# Patient Record
Sex: Female | Born: 1995
Health system: Southern US, Community
[De-identification: ages and names within clinical notes are randomized; demographics above are authoritative.]

## PROBLEM LIST (undated history)

## (undated) DIAGNOSIS — D649 Anemia, unspecified: Secondary | ICD-10-CM

## (undated) DIAGNOSIS — J45909 Unspecified asthma, uncomplicated: Secondary | ICD-10-CM

## (undated) DIAGNOSIS — F419 Anxiety disorder, unspecified: Secondary | ICD-10-CM

## (undated) DIAGNOSIS — O24419 Gestational diabetes mellitus in pregnancy, unspecified control: Secondary | ICD-10-CM

## (undated) HISTORY — DX: Gestational diabetes mellitus in pregnancy, unspecified control: O24.419

## (undated) HISTORY — DX: Unspecified asthma, uncomplicated: J45.909

## (undated) HISTORY — PX: NO PAST SURGERIES: SHX2092

## (undated) HISTORY — DX: Anxiety disorder, unspecified: F41.9

---

## 2017-02-24 ENCOUNTER — Ambulatory Visit (INDEPENDENT_AMBULATORY_CARE_PROVIDER_SITE_OTHER): Payer: 59 | Admitting: Advanced Practice Midwife

## 2017-02-24 ENCOUNTER — Other Ambulatory Visit (HOSPITAL_COMMUNITY)
Admission: RE | Admit: 2017-02-24 | Discharge: 2017-02-24 | Disposition: A | Discharge status: Home / Self Care | Payer: 59 | Source: Ambulatory Visit | Attending: Advanced Practice Midwife | Admitting: Advanced Practice Midwife

## 2017-02-24 ENCOUNTER — Encounter: Payer: Self-pay | Admitting: Advanced Practice Midwife

## 2017-02-24 VITALS — BP 138/70 | HR 64 | Ht 63.25 in | Wt 238.0 lb

## 2017-02-24 DIAGNOSIS — Z01419 Encounter for gynecological examination (general) (routine) without abnormal findings: Secondary | ICD-10-CM

## 2017-02-24 DIAGNOSIS — R87610 Atypical squamous cells of undetermined significance on cytologic smear of cervix (ASC-US): Secondary | ICD-10-CM | POA: Diagnosis not present

## 2017-02-24 MED ORDER — NORGESTIMATE-ETH ESTRADIOL 0.25-35 MG-MCG PO TABS: 1.0000 | ORAL_TABLET | Freq: Every day | ORAL | 3 refills | Status: DC

## 2017-02-24 NOTE — Progress Notes (Signed)
Monique Fox 21 y.o.  Vitals:   02/24/17 0901  BP: 138/70  Pulse: 64     Filed Weights   02/24/17 0901  Weight: 238 lb (108 kg)    Past Medical History: Past Medical History:  Diagnosis Date  . Asthma     Past Surgical History: History reviewed. No pertinent surgical history.  Family History: Family History  Problem Relation Age of Onset  . Cancer Paternal Grandmother     cervical, abdominal, colon  . Breast cancer Paternal Grandmother   . Diabetes Maternal Grandmother   . Stroke Maternal Grandmother   . Cancer Maternal Grandfather     sinus    Social History: Social History  Substance Use Topics  . Smoking status: Never Smoker  . Smokeless tobacco: Never Used  . Alcohol use 0.6 oz/week    1 Glasses of wine per week    Allergies: No Known Allergies   No current outpatient prescriptions on file.  History of Present Illness: Here for first pap smear.  Using condoms for birth control., wants pills. Has not had gardisil.  Works at [employer]   Review of Systems   Patient denies any headaches, blurred vision, shortness of breath, chest pain, abdominal pain, problems with bowel movements, urination, or intercourse.   Physical Exam: General:  Well developed, well nourished, no acute distress Skin:  Warm and dry Neck:  Midline trachea, normal thyroid Lungs; Clear to auscultation bilaterally Breast:  No dominant palpable mass, retraction, or nipple discharge Cardiovascular: Regular rate and rhythm Abdomen:  Soft, non tender, no hepatosplenomegaly Pelvic:  External genitalia is normal in appearance.  The vagina is normal in appearance.  The cervix is non friable. Uterus is felt to be normal size, shape, and contour.  No adnexal masses or tenderness noted. Exam limited by habitus Extremities:  No swelling or varicosities noted Psych:  No mood changes.     Impression: Normal gyn exam     Plan: If pap normal, repeat q 3 years.  Start COC on first  day of next period.

## 2017-02-24 NOTE — Patient Instructions (Addendum)
Start your birth control pill on the first day of your next period.      HPV (Human Papillomavirus) Vaccine: What You Need to Know 1. Why get vaccinated? HPV vaccine prevents infection with human papillomavirus (HPV) types that are associated with many cancers, including:  cervical cancer in females,  vaginal and vulvar cancers in females,  anal cancer in females and males,  throat cancer in females and males, and  penile cancer in males. In addition, HPV vaccine prevents infection with HPV types that cause genital warts in both females and males. In the [place] about 12,000 women get cervical cancer every year, and about 4,000 women die from it. HPV vaccine can prevent most of these cases of cervical cancer. Vaccination is not a substitute for cervical cancer screening. This vaccine does not protect against all HPV types that can cause cervical cancer. Women should still get regular Pap tests.  HPV infection usually comes from sexual contact, and most people will become infected at some point in their life. About 14 million Americans, including teens, get infected every year. Most infections will go away on their own and not cause serious problems. But thousands of women and men get cancer and other diseases from HPV. 2. HPV vaccine HPV vaccine is approved by FDA and is recommended by CDC for both males and females. It is routinely given at 22 or 22 years of age, but it may be given beginning at age 22 years through age 30 years. Most adolescents 9 through 22 years of age should get HPV vaccine as a two-dose series with the doses separated by 6-12 months. People who start HPV vaccination at 22 years of age and older should get the vaccine as a three-dose series with the second dose given 1-2 months after the first dose and the third dose given 6 months after the first dose. There are several exceptions to these age recommendations. Your health care provider can give you more information. 3.  Some people should not get this vaccine  Anyone who has had a severe (life-threatening) allergic reaction to a dose of HPV vaccine should not get another dose.  Anyone who has a severe (life threatening) allergy to any component of HPV vaccine should not get the vaccine.  Tell your doctor if you have any severe allergies that you know of, including a severe allergy to yeast.  HPV vaccine is not recommended for pregnant women. If you learn that you were pregnant when you were vaccinated, there is no reason to expect any problems for you or your baby. Any woman who learns she was pregnant when she got HPV vaccine is encouraged to contact the manufacturer's registry for HPV vaccination during pregnancy at [phone]. Women who are breastfeeding may be vaccinated.  If you have a mild illness, such as a cold, you can probably get the vaccine today. If you are moderately or severely ill, you should probably wait until you recover. Your doctor can advise you. 4. Risks of a vaccine reaction With any medicine, including vaccines, there is a chance of side effects. These are usually mild and go away on their own, but serious reactions are also possible. Most people who get HPV vaccine do not have any serious problems with it. Mild or moderate problems following HPV vaccine:   Reactions in the arm where the shot was given:  Soreness (about 9 people in 10)  Redness or swelling (about 1 person in 3)  Fever:  Mild (100F) (about 1  person in 10)  Moderate (102F) (about 1 person in 64)  Other problems:  Headache (about 1 person in 3) Problems that could happen after any injected vaccine:   People sometimes faint after a medical procedure, including vaccination. Sitting or lying down for about 15 minutes can help prevent fainting, and injuries caused by a fall. Tell your doctor if you feel dizzy, or have vision changes or ringing in the ears.  Some people get severe pain in the shoulder and  have difficulty moving the arm where a shot was given. This happens very rarely.  Any medication can cause a severe allergic reaction. Such reactions from a vaccine are very rare, estimated at about 1 in a million doses, and would happen within a few minutes to a few hours after the vaccination. As with any medicine, there is a very remote chance of a vaccine causing a serious injury or death. The safety of vaccines is always being monitored. For more information, visit: http://www.aguilar.org/. 5. What if there is a serious reaction? What should I look for?  Look for anything that concerns you, such as signs of a severe allergic reaction, very high fever, or unusual behavior. Signs of a severe allergic reaction can include hives, swelling of the face and throat, difficulty breathing, a fast heartbeat, dizziness, and weakness. These would usually start a few minutes to a few hours after the vaccination. What should I do?  If you think it is a severe allergic reaction or other emergency that can't wait, call 9-1-1 or get to the nearest hospital. Otherwise, call your doctor. Afterward, the reaction should be reported to the Vaccine Adverse Event Reporting System (VAERS). Your doctor should file this report, or you can do it yourself through the VAERS web site at www.vaers.SamedayNews.es, or by calling [phone]. VAERS does not give medical advice.  6. The National Vaccine Injury Compensation Program The [employer] Vaccine Injury Compensation Program (VICP) is a federal program that was created to compensate people who may have been injured by certain vaccines. Persons who believe they may have been injured by a vaccine can learn about the program and about filing a claim by calling (431)600-1203 or visiting the [place] website at GoldCloset.com.ee. There is a time limit to file a claim for compensation. 7. How can I learn more?  Ask your health care provider. He or she can give you the  vaccine package insert or suggest other sources of information.  Call your local or state health department.  Contact the Centers for Disease Control and Prevention (CDC):  Call (218)685-8058 (1-800-CDC-INFO) or  Visit CDC's website at http://sweeney-todd.com/ Vaccine Information Statement, HPV Vaccine (10/05/2015) This information is not intended to replace advice given to you by your health care provider. Make sure you discuss any questions you have with your health care provider. Document Released: 05/17/2014 Document Revised: 07/10/2016 Document Reviewed: 07/10/2016 Elsevier Interactive Patient Education  2017 [employer].

## 2017-03-02 LAB — CYTOLOGY - PAP
Chlamydia: NEGATIVE
DIAGNOSIS: UNDETERMINED — AB
HPV: NOT DETECTED
NEISSERIA GONORRHEA: NEGATIVE

## 2017-03-03 ENCOUNTER — Encounter: Payer: Self-pay | Admitting: Advanced Practice Midwife

## 2017-03-26 ENCOUNTER — Encounter: Payer: Self-pay | Admitting: Advanced Practice Midwife

## 2017-04-29 DIAGNOSIS — J0100 Acute maxillary sinusitis, unspecified: Secondary | ICD-10-CM | POA: Diagnosis not present

## 2017-05-16 ENCOUNTER — Encounter: Payer: Self-pay | Admitting: Advanced Practice Midwife

## 2017-05-19 ENCOUNTER — Other Ambulatory Visit: Payer: Self-pay | Admitting: Advanced Practice Midwife

## 2017-05-19 MED ORDER — NORGESTIMATE-ETH ESTRADIOL 0.25-35 MG-MCG PO TABS: 1.0000 | ORAL_TABLET | Freq: Every day | ORAL | 3 refills | Status: DC

## 2017-05-19 MED ORDER — FLUCONAZOLE 150 MG PO TABS: ORAL_TABLET | ORAL | 2 refills | Status: DC

## 2017-05-19 NOTE — Progress Notes (Signed)
diflu

## 2017-06-22 SURGERY — Surgical Case
Anesthesia: *Unknown

## 2017-09-12 ENCOUNTER — Encounter: Payer: Self-pay | Admitting: Advanced Practice Midwife

## 2017-09-28 ENCOUNTER — Telehealth: Payer: 59 | Admitting: Family

## 2017-09-28 DIAGNOSIS — L235 Allergic contact dermatitis due to other chemical products: Secondary | ICD-10-CM | POA: Diagnosis not present

## 2017-09-28 MED ORDER — PREDNISONE 10 MG PO TABS: 10.0000 mg | ORAL_TABLET | ORAL | 0 refills | Status: DC

## 2017-09-28 NOTE — Progress Notes (Signed)
Thank you for the details you included in the comment boxes. Those details are very helpful in determining the best course of treatment for you and help [place] to provide the best care. Usually, these reactions will clear up quite quickly. The only option here is prednisone. That usually works when all else fails. Congrats on your wedding! It will likely be cleared up by then as I have seen far worse reactions clear up with this treatment I'm giving you. No promises, but I'm very optimistic!  E Visit for Rash  We are sorry that you are not feeling well. Here is how we plan to help!  Based on what you shared with me it looks like you have contact dermatitis.  Contact dermatitis is a skin rash caused by something that touches the skin and causes irritation or inflammation.  Your skin may be red, swollen, dry, cracked, and itch.  The rash should go away in a few days but can last a few weeks.  If you get a rash, it's important to figure out what caused it so the irritant can be avoided in the future.  I have sent the following:   Sterapred 10 mg dose pak   HOME CARE:   Take cool showers and avoid direct sunlight.  Apply cool compress or wet dressings.  Take a bath in an oatmeal bath.  Sprinkle content of one Aveeno packet under running faucet with comfortably warm water.  Bathe for 15-20 minutes, 1-2 times daily.  Pat dry with a towel. Do not rub the rash.  Use hydrocortisone cream.  Take an antihistamine like Benadryl for widespread rashes that itch.  The adult dose of Benadryl is 25-50 mg by mouth 4 times daily.  Caution:  This type of medication may cause sleepiness.  Do not drink alcohol, drive, or operate dangerous machinery while taking antihistamines.  Do not take these medications if you have prostate enlargement.  Read package instructions thoroughly on all medications that you take.  GET HELP RIGHT AWAY IF:   Symptoms don't go away after treatment.  Severe itching that  persists.  If you rash spreads or swells.  If you rash begins to smell.  If it blisters and opens or develops a yellow-brown crust.  You develop a fever.  You have a sore throat.  You become short of breath.  MAKE SURE YOU:  Understand these instructions. Will watch your condition. Will get help right away if you are not doing well or get worse.  Thank you for choosing an e-visit. Your e-visit answers were reviewed by a board certified advanced clinical practitioner to complete your personal care plan. Depending upon the condition, your plan could have included both over the counter or prescription medications. Please review your pharmacy choice. Be sure that the pharmacy you have chosen is open so that you can pick up your prescription now.  If there is a problem you may message your provider in [place] to have the prescription routed to another pharmacy. Your safety is important to [place]. If you have drug allergies check your prescription carefully.  For the next 24 hours, you can use MyChart to ask questions about today's visit, request a non-urgent call back, or ask for a work or school excuse from your e-visit provider. You will get an email in the next two days asking about your experience. I hope that your e-visit has been valuable and will speed your recovery.

## 2017-10-23 ENCOUNTER — Telehealth: Payer: 59 | Admitting: Family

## 2017-10-23 DIAGNOSIS — L259 Unspecified contact dermatitis, unspecified cause: Secondary | ICD-10-CM

## 2017-10-23 MED ORDER — PREDNISONE 10 MG PO TABS: 10.0000 mg | ORAL_TABLET | ORAL | 0 refills | Status: DC

## 2017-10-23 NOTE — Progress Notes (Signed)
Thank you for the details you included in the comment boxes. Those details are very helpful in determining the best course of treatment for you and help [place] to provide the best care.  E Visit for Rash  We are sorry that you are not feeling well. Here is how we plan to help!  Based on what you shared with me it looks like you have contact dermatitis.  Contact dermatitis is a skin rash caused by something that touches the skin and causes irritation or inflammation.  Your skin may be red, swollen, dry, cracked, and itch.  The rash should go away in a few days but can last a few weeks.  If you get a rash, it's important to figure out what caused it so the irritant can be avoided in the future.  Prednisone sterapred pack 10mg  sent to pharmacy  HOME CARE:   Take cool showers and avoid direct sunlight.  Apply cool compress or wet dressings.  Take a bath in an oatmeal bath.  Sprinkle content of one Aveeno packet under running faucet with comfortably warm water.  Bathe for 15-20 minutes, 1-2 times daily.  Pat dry with a towel. Do not rub the rash.  Use hydrocortisone cream.  Take an antihistamine like Benadryl for widespread rashes that itch.  The adult dose of Benadryl is 25-50 mg by mouth 4 times daily.  Caution:  This type of medication may cause sleepiness.  Do not drink alcohol, drive, or operate dangerous machinery while taking antihistamines.  Do not take these medications if you have prostate enlargement.  Read package instructions thoroughly on all medications that you take.  GET HELP RIGHT AWAY IF:   Symptoms don't go away after treatment.  Severe itching that persists.  If you rash spreads or swells.  If you rash begins to smell.  If it blisters and opens or develops a yellow-brown crust.  You develop a fever.  You have a sore throat.  You become short of breath.  MAKE SURE YOU:  Understand these instructions. Will watch your condition. Will get help right away if you  are not doing well or get worse.  Thank you for choosing an e-visit. Your e-visit answers were reviewed by a board certified advanced clinical practitioner to complete your personal care plan. Depending upon the condition, your plan could have included both over the counter or prescription medications. Please review your pharmacy choice. Be sure that the pharmacy you have chosen is open so that you can pick up your prescription now.  If there is a problem you may message your provider in [place] to have the prescription routed to another pharmacy. Your safety is important to [place]. If you have drug allergies check your prescription carefully.  For the next 24 hours, you can use MyChart to ask questions about today's visit, request a non-urgent call back, or ask for a work or school excuse from your e-visit provider. You will get an email in the next two days asking about your experience. I hope that your e-visit has been valuable and will speed your recovery.

## 2017-11-14 ENCOUNTER — Encounter: Payer: Self-pay | Admitting: Advanced Practice Midwife

## 2017-12-02 DIAGNOSIS — J4520 Mild intermittent asthma, uncomplicated: Secondary | ICD-10-CM | POA: Diagnosis not present

## 2017-12-25 DIAGNOSIS — D485 Neoplasm of uncertain behavior of skin: Secondary | ICD-10-CM | POA: Diagnosis not present

## 2017-12-25 DIAGNOSIS — D225 Melanocytic nevi of trunk: Secondary | ICD-10-CM | POA: Diagnosis not present

## 2017-12-27 ENCOUNTER — Telehealth: Payer: 59 | Admitting: Physician Assistant

## 2017-12-27 DIAGNOSIS — R6889 Other general symptoms and signs: Secondary | ICD-10-CM

## 2017-12-27 MED ORDER — OSELTAMIVIR PHOSPHATE 75 MG PO CAPS: 75.0000 mg | ORAL_CAPSULE | Freq: Two times a day (BID) | ORAL | 0 refills | Status: DC

## 2017-12-27 NOTE — Progress Notes (Signed)

## 2018-04-16 DIAGNOSIS — Z0100 Encounter for examination of eyes and vision without abnormal findings: Secondary | ICD-10-CM | POA: Diagnosis not present

## 2019-01-15 DIAGNOSIS — J302 Other seasonal allergic rhinitis: Secondary | ICD-10-CM | POA: Diagnosis not present

## 2019-01-15 DIAGNOSIS — J4530 Mild persistent asthma, uncomplicated: Secondary | ICD-10-CM | POA: Diagnosis not present

## 2020-07-06 ENCOUNTER — Other Ambulatory Visit: Payer: Self-pay

## 2020-07-06 ENCOUNTER — Emergency Department (HOSPITAL_BASED_OUTPATIENT_CLINIC_OR_DEPARTMENT_OTHER)
Admission: EM | Admit: 2020-07-06 | Discharge: 2020-07-06 | Disposition: A | Discharge status: Home / Self Care | Payer: No Typology Code available for payment source | Attending: Emergency Medicine | Admitting: Emergency Medicine

## 2020-07-06 ENCOUNTER — Encounter (HOSPITAL_BASED_OUTPATIENT_CLINIC_OR_DEPARTMENT_OTHER): Payer: Self-pay

## 2020-07-06 ENCOUNTER — Emergency Department (HOSPITAL_BASED_OUTPATIENT_CLINIC_OR_DEPARTMENT_OTHER): Payer: No Typology Code available for payment source

## 2020-07-06 DIAGNOSIS — J45909 Unspecified asthma, uncomplicated: Secondary | ICD-10-CM | POA: Diagnosis not present

## 2020-07-06 DIAGNOSIS — R531 Weakness: Secondary | ICD-10-CM | POA: Insufficient documentation

## 2020-07-06 DIAGNOSIS — R202 Paresthesia of skin: Secondary | ICD-10-CM | POA: Diagnosis not present

## 2020-07-06 DIAGNOSIS — R519 Headache, unspecified: Secondary | ICD-10-CM | POA: Diagnosis present

## 2020-07-06 DIAGNOSIS — H539 Unspecified visual disturbance: Secondary | ICD-10-CM | POA: Diagnosis not present

## 2020-07-06 HISTORY — DX: Anemia, unspecified: D64.9

## 2020-07-06 LAB — COMPREHENSIVE METABOLIC PANEL
ALT: 17 U/L (ref 0–44)
AST: 14 U/L — ABNORMAL LOW (ref 15–41)
Albumin: 3.5 g/dL (ref 3.5–5.0)
Alkaline Phosphatase: 72 U/L (ref 38–126)
Anion gap: 8 (ref 5–15)
BUN: 8 mg/dL (ref 6–20)
CO2: 25 mmol/L (ref 22–32)
Calcium: 8.6 mg/dL — ABNORMAL LOW (ref 8.9–10.3)
Chloride: 103 mmol/L (ref 98–111)
Creatinine, Ser: 0.73 mg/dL (ref 0.44–1.00)
GFR calc Af Amer: >60 mL/min (ref >60)
GFR calc non Af Amer: >60 mL/min (ref >60)
Glucose, Bld: 98 mg/dL (ref 70–99)
Potassium: 3.9 mmol/L (ref 3.5–5.1)
Sodium: 136 mmol/L (ref 135–145)
Total Bilirubin: 0.3 mg/dL (ref 0.3–1.2)
Total Protein: 7.2 g/dL (ref 6.5–8.1)

## 2020-07-06 LAB — PROTIME-INR
INR: 1 (ref 0.8–1.2)
Prothrombin Time: 12.4 seconds (ref 11.4–15.2)

## 2020-07-06 LAB — CBG MONITORING, ED: Glucose-Capillary: 93 mg/dL (ref 70–99)

## 2020-07-06 LAB — DIFFERENTIAL
Abs Immature Granulocytes: 0.04 10*3/uL (ref 0.00–0.07)
Basophils Absolute: 0.1 10*3/uL (ref 0.0–0.1)
Basophils Relative: 1 %
Eosinophils Absolute: 0.1 10*3/uL (ref 0.0–0.5)
Eosinophils Relative: 1 %
Immature Granulocytes: 0 %
Lymphocytes Relative: 16 %
Lymphs Abs: 2.1 10*3/uL (ref 0.7–4.0)
Monocytes Absolute: 0.7 10*3/uL (ref 0.1–1.0)
Monocytes Relative: 5 %
Neutro Abs: 10.4 10*3/uL — ABNORMAL HIGH (ref 1.7–7.7)
Neutrophils Relative %: 77 %

## 2020-07-06 LAB — CBC
HCT: 39.7 % (ref 36.0–46.0)
Hemoglobin: 12.4 g/dL (ref 12.0–15.0)
MCH: 26.1 pg (ref 26.0–34.0)
MCHC: 31.2 g/dL (ref 30.0–36.0)
MCV: 83.4 fL (ref 80.0–100.0)
Platelets: 341 10*3/uL (ref 150–400)
RBC: 4.76 MIL/uL (ref 3.87–5.11)
RDW: 18 % — ABNORMAL HIGH (ref 11.5–15.5)
WBC: 13.4 10*3/uL — ABNORMAL HIGH (ref 4.0–10.5)
nRBC: 0 % (ref 0.0–0.2)

## 2020-07-06 LAB — PREGNANCY, URINE: Preg Test, Ur: NEGATIVE

## 2020-07-06 LAB — APTT: aPTT: 27 seconds (ref 24–36)

## 2020-07-06 MED ORDER — DIPHENHYDRAMINE HCL 50 MG/ML IJ SOLN
25.0000 mg | Freq: Once | INTRAMUSCULAR | Status: AC
  Administered 2020-07-06: 25 mg via INTRAVENOUS
  Filled 2020-07-06: qty 1

## 2020-07-06 MED ORDER — PROCHLORPERAZINE EDISYLATE 10 MG/2ML IJ SOLN
10.0000 mg | Freq: Once | INTRAMUSCULAR | Status: AC
  Administered 2020-07-06: 10 mg via INTRAVENOUS
  Filled 2020-07-06: qty 2

## 2020-07-06 NOTE — Discharge Instructions (Addendum)
Your work-up today was overall reassuring.  As discussed, please make sure to return to the ER if you have any new or worsening symptoms.  You may also follow-up with your primary care doctor about the symptoms that you are experiencing today.

## 2020-07-06 NOTE — ED Triage Notes (Addendum)
Sudden loss of peripheral vision on the R side with dizziness, and R sided numbness. Symptoms started at 12:00 today and lasted for 20 minutes. Pt states symptoms have resolved, but now has a lingering HA. BEFAST screening in triage is negative. Pt noted to have a down turned lip on the R side, but pt states that is baseline for her.

## 2020-07-06 NOTE — ED Provider Notes (Addendum)
[facility] EMERGENCY DEPARTMENT Provider Note   CSN: [id number] Arrival date & time: 07/06/20  1244     History No chief complaint on file.   Monique Fox is a 24 y.o. female.  HPI 24 year old female with a history of anemia presents to the ER after an episode of sudden right-sided peripheral vision loss and dizziness with associated right-sided numbness which occurred earlier today.  Patient states that she was on her lunch break and went to go get gas.  When she walked into the gas station, she suddenly felt very dizzy, felt like she lost vision to her right eye, and stated that she felt like her right arm went numb. Denies any chest pain, shortness of breath, nausea, vomiting, fever, cough, back pain at that time. States that this episode lasted about 15 to 20 minutes and then ceased. She now has a different headache, rating it a 7/10. Denies any fevers, neck stiffness, or chills. Denies any residual right-sided numbness, vision has returned to baseline. States she has a history of anemia and asthma but no other medical problems. Recently started nursing school several weeks ago, reports proper nutrition and hydration.  Has no history of migraines. Has never had this happen before.    Past Medical History:  Diagnosis Date  . Anemia   . Asthma     There are no problems to display for this patient.   History reviewed. No pertinent surgical history.   OB History   No obstetric history on file.     No family history on file.  Social History   Tobacco Use  . Smoking status: Never Smoker  . Smokeless tobacco: Never Used  Vaping Use  . Vaping Use: Never used  Substance Use Topics  . Alcohol use: Yes    Comment: rarely  . Drug use: Never    Home Medications Prior to Admission medications   Not on File    Allergies    Patient has no known allergies.  Review of Systems   Review of Systems  Constitutional: Negative for chills and fever.  HENT: Negative  for ear pain and sore throat.   Eyes: Positive for visual disturbance. Negative for pain.  Respiratory: Negative for cough and shortness of breath.   Cardiovascular: Negative for chest pain and palpitations.  Gastrointestinal: Negative for abdominal pain and vomiting.  Genitourinary: Negative for dysuria and hematuria.  Musculoskeletal: Negative for arthralgias and back pain.  Skin: Negative for color change and rash.  Neurological: Positive for dizziness, weakness, numbness and headaches. Negative for tremors, seizures, syncope and light-headedness.  Psychiatric/Behavioral: Negative for confusion.  All other systems reviewed and are negative.   Physical Exam Updated Vital Signs BP (!) 146/97   Pulse 87   Temp 98.4 F (36.9 C) (Oral)   Resp 13   Ht 5\' 3"  (1.6 m)   Wt 117.9 kg   LMP 06/13/2020   SpO2 100%   BMI 46.06 kg/m   Physical Exam Vitals and nursing note reviewed.  Constitutional:      General: She is not in acute distress.    Appearance: She is well-developed. She is obese. She is not ill-appearing or toxic-appearing.  HENT:     Head: Normocephalic and atraumatic.     Nose: Nose normal.     Mouth/Throat:     Mouth: Mucous membranes are moist.     Pharynx: Oropharynx is clear.  Eyes:     Conjunctiva/sclera: Conjunctivae normal.     Pupils: Pupils  are equal, round, and reactive to light.  Cardiovascular:     Rate and Rhythm: Normal rate and regular rhythm.     Pulses: Normal pulses.     Heart sounds: Normal heart sounds. No murmur heard.   Pulmonary:     Effort: Pulmonary effort is normal. No respiratory distress.     Breath sounds: Normal breath sounds.  Abdominal:     General: Abdomen is flat.     Palpations: Abdomen is soft.     Tenderness: There is no abdominal tenderness.  Musculoskeletal:        General: No tenderness or deformity.     Cervical back: Normal range of motion and neck supple.  Skin:    General: Skin is warm and dry.     Findings: No  bruising or erythema.  Neurological:     General: No focal deficit present.     Mental Status: She is alert and oriented to person, place, and time.     Sensory: No sensory deficit.     Motor: No weakness.     Comments: Mental Status:  Alert, thought content appropriate, able to give a coherent history. Speech fluent without evidence of aphasia. Able to follow 2 step commands without difficulty.  Cranial Nerves:  II: Peripheral visual fields grossly normal, pupils equal, round, reactive to light III,IV, VI: ptosis not present, extra-ocular motions intact bilaterally  V,VII: smile symmetric, facial light touch sensation equal VIII: hearing grossly normal to voice  X: uvula elevates symmetrically  XI: bilateral shoulder shrug symmetric and strong XII: midline tongue extension without fassiculations Motor:  Normal tone. 5/5 strength of BUE and BLE major muscle groups including strong and equal grip strength and dorsiflexion/plantar flexion Sensory: light touch normal in all extremities. Cerebellar: normal finger-to-nose with bilateral upper extremities, Romberg sign absent Gait: not accessed    Psychiatric:        Mood and Affect: Mood normal.        Behavior: Behavior normal.     ED Results / Procedures / Treatments   Labs (all labs ordered are listed, but only abnormal results are displayed) Labs Reviewed  CBC - Abnormal; Notable for the following components:      Result Value   WBC 13.4 (*)    RDW 18.0 (*)    All other components within normal limits  DIFFERENTIAL - Abnormal; Notable for the following components:   Neutro Abs 10.4 (*)    All other components within normal limits  COMPREHENSIVE METABOLIC PANEL - Abnormal; Notable for the following components:   Calcium 8.6 (*)    AST 14 (*)    All other components within normal limits  PROTIME-INR  APTT  PREGNANCY, URINE  CBG MONITORING, ED    EKG EKG Interpretation  Date/Time:  Friday July 06 2020 13:46:27  EDT Ventricular Rate:  107 PR Interval:  124 QRS Duration: 80 QT Interval:  340 QTC Calculation: 453 R Axis:   73 Text Interpretation: Sinus tachycardia Otherwise normal ECG No old tracing to compare Confirmed by Calvert Cantor [phone]) on 07/06/2020 1:58:54 PM   Radiology CT HEAD WO CONTRAST  Result Date: 07/06/2020 CLINICAL DATA:  Transient ischemic attack. Sudden loss of peripheral vision. Dizziness and right-sided numbness. EXAM: CT HEAD WITHOUT CONTRAST TECHNIQUE: Contiguous axial images were obtained from the base of the skull through the vertex without intravenous contrast. COMPARISON:  None. FINDINGS: Brain: No evidence acute large vascular territory infarct. Tiny hypodensity in the inferior left basal ganglia is favored  to reflect a dilated perivascular space given the location. No acute hemorrhage. No hydrocephalus. No mass lesion or abnormal mass effect. Vascular: No hyperdense vessel identified Skull: Normal. Negative for fracture or focal lesion. Sinuses/Orbits: No acute finding. Other: No mastoid effusion. IMPRESSION: No evidence of acute intracranial abnormality. Electronically Signed   By: Margaretha Sheffield MD   On: 07/06/2020 14:10    Procedures Procedures (including critical care time)  Medications Ordered in ED Medications  prochlorperazine (COMPAZINE) injection 10 mg (10 mg Intravenous Given 07/06/20 1507)  diphenhydrAMINE (BENADRYL) injection 25 mg (25 mg Intravenous Given 07/06/20 1510)    ED Course  I have reviewed the triage vital signs and the nursing notes.  Pertinent labs & imaging results that were available during my care of the patient were reviewed by me and considered in my medical decision making (see chart for details).    MDM Rules/Calculators/A&P                         24 year old female with right-sided numbness, right-sided peripheral vision loss, dizziness for 15 to 20 minutes. Now asymptomatic other than a residual headache. Presentation, the  patient is alert, oriented, nontoxic-appearing, no acute distress, with no gross neuro deficits. Vitals overall reassuring. Physical exam with no neck stiffness, no neurologic deficits. Abdomen soft and nontender.  DDx includes TIA/stroke, migraines, acute angle closure glaucoma, amarosis fugax,  CRAO/CRVO, optic neuritis, temporal arteritis, anxiety    CT scan done in triage without evidence of acute ischemia. EKG sinus tach but no evidence of arrythmias, ST elevations or T wave inversion.  CBC with a leukocytosis of 13.4.  Patient however denies any fevers, chills, cough, neck stiffness, or any other infectious symptoms.  Pregnancy negative.  BMP normal.  Patient was given migraine cocktail and noted significant improvement in her symptoms.  Tachycardia improved. No reocurring neurologic symptoms, vision remains intact.  Patient does admit that she has been having some anxiety.  Suspect that this might be a cause of her symptoms.  Despite leukocytosis and increased neutrophil count, patient denies any cough, fevers, chills, dysuria, vaginal discharge, or any other infectious symptoms.  We discussed strict return precautions which she voiced understanding and is agreeable to.  At this stage in the ED course, the patient has been medically screened and is stable for discharge.  Final Clinical Impression(s) / ED Diagnoses Final diagnoses:  Nonintractable headache, unspecified chronicity pattern, unspecified headache type    Rx / DC Orders ED Discharge Orders    None         Lyndel Safe 07/06/20 1615    Truddie Hidden, MD 07/06/20 [phone]

## 2020-07-06 NOTE — ED Notes (Signed)
ED Provider at bedside. 

## 2020-10-24 ENCOUNTER — Encounter: Payer: Self-pay | Admitting: Advanced Practice Midwife

## 2021-09-28 IMAGING — CT CT HEAD W/O CM
3 series · 16 of 47 positions shown, 19 images · non-contrast
Comparison: None.

CLINICAL DATA: Transient ischemic attack. Sudden loss of peripheral
vision. Dizziness and right-sided numbness.

EXAM:
CT HEAD WITHOUT CONTRAST
TECHNIQUE: Contiguous axial images were obtained from the base of the skull
through the vertex without intravenous contrast.

[Series 2: head wo · axial · 0.45mm/px · z∈[+844,+974]mm · 10 of 32 slices shown, 13 images]
[im 3/32  brain]
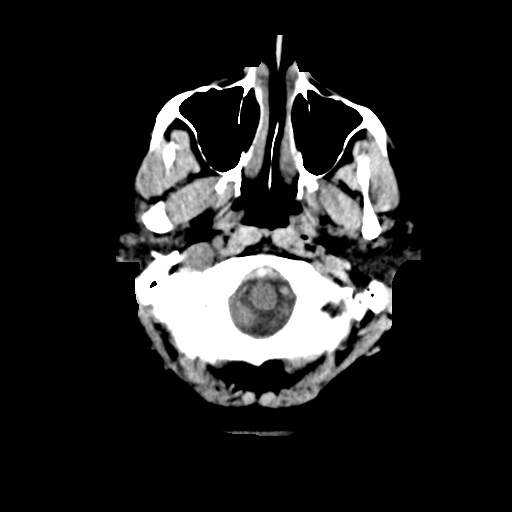
[im 3/32  bone]
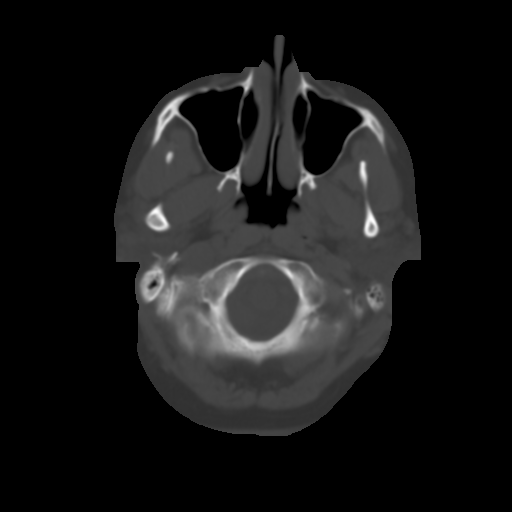
[im 6/32  brain]
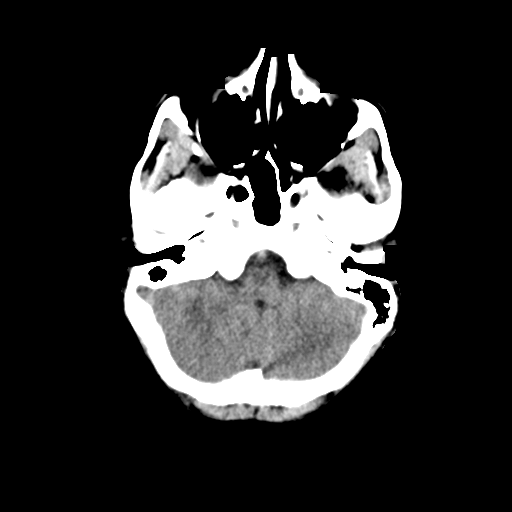
[im 9/32  brain]
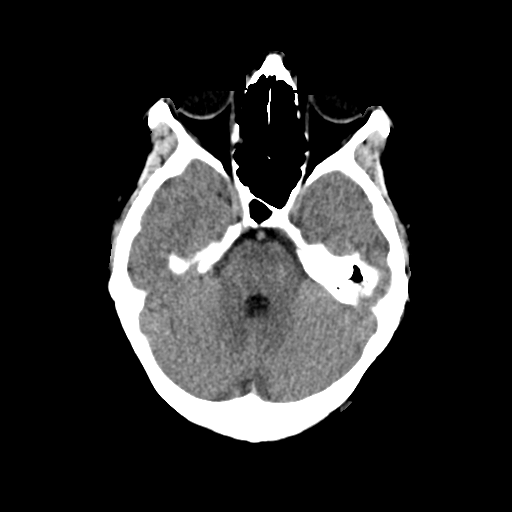
[im 11/32  brain]
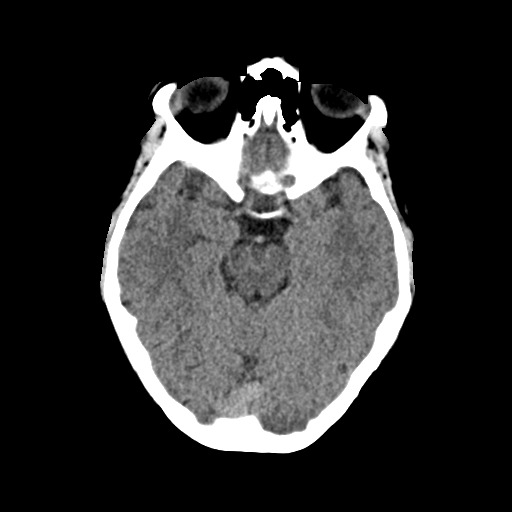
[im 14/32  brain]
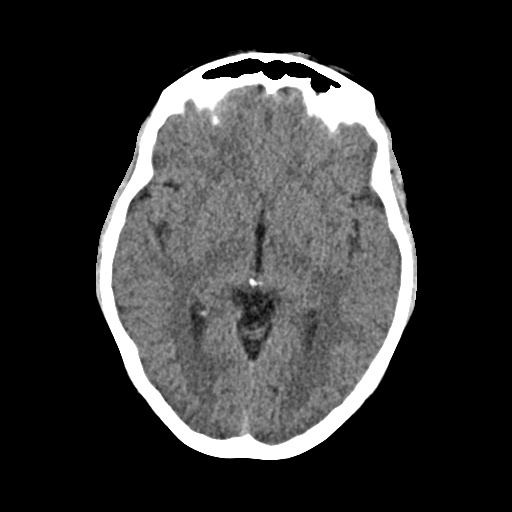
[im 14/32  bone]
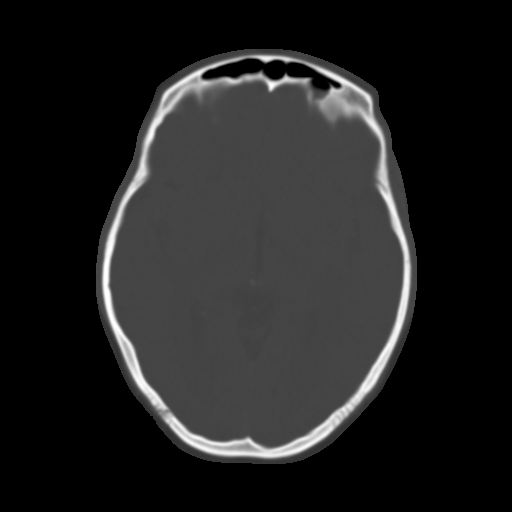
[im 18/32  brain]
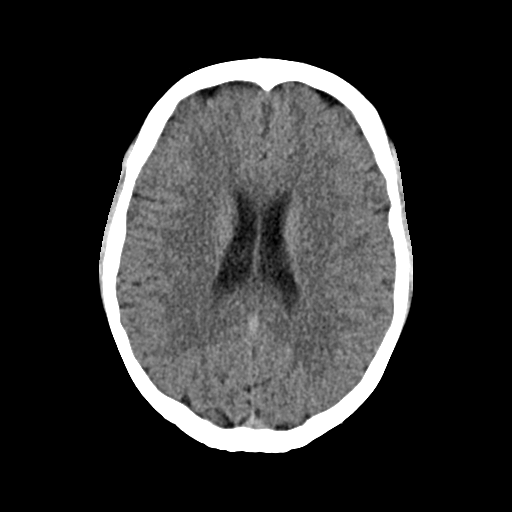
[im 21/32  brain]
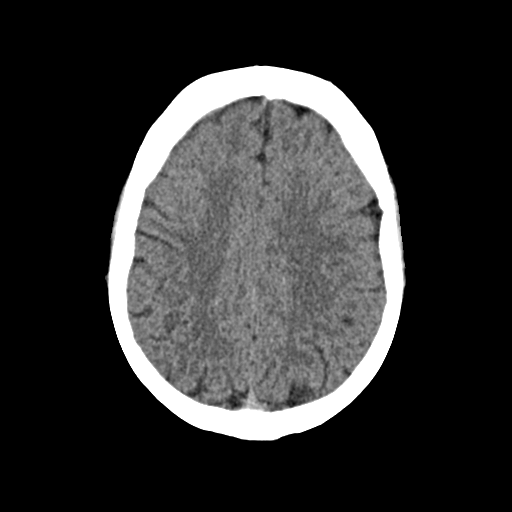
[im 24/32  brain]
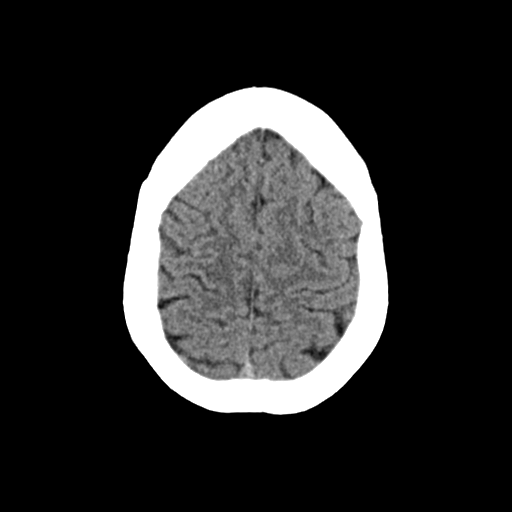
[im 26/32  brain]
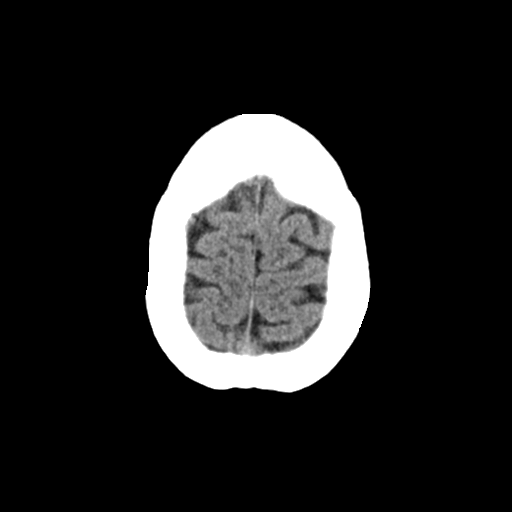
[im 26/32  bone]
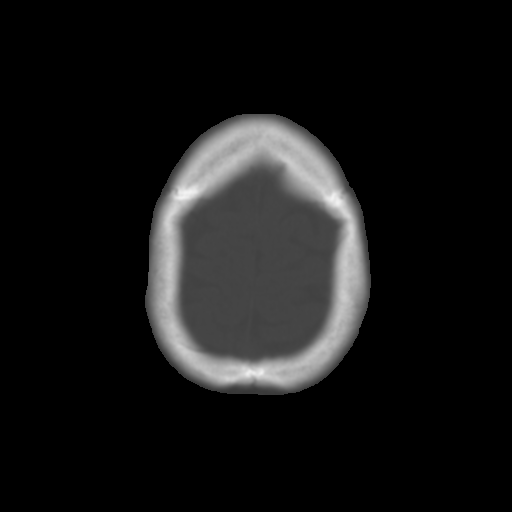
[im 29/32  brain]
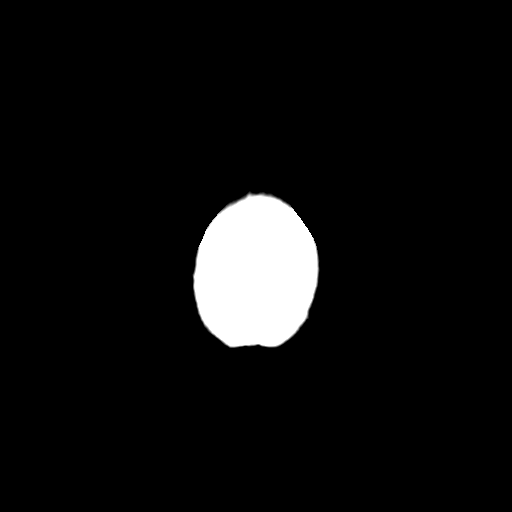

[Series 4: coronal soft · coronal · 0.31mm/px · 3 of 68 slices shown]
[im 23/68  brain]
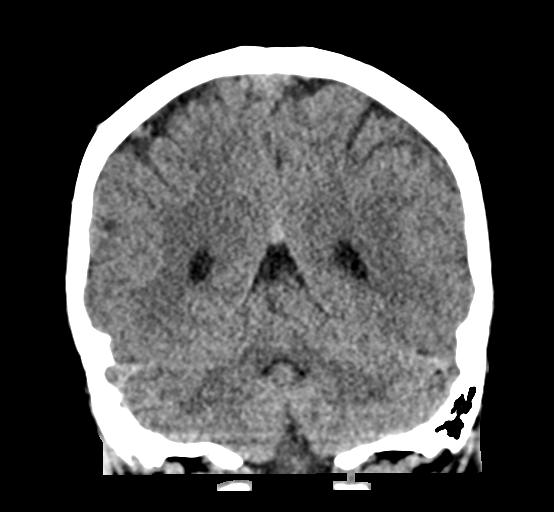
[im 30/68  brain]
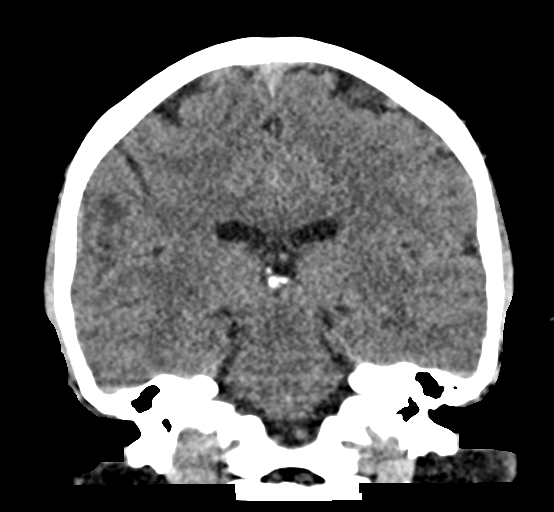
[im 38/68  brain]
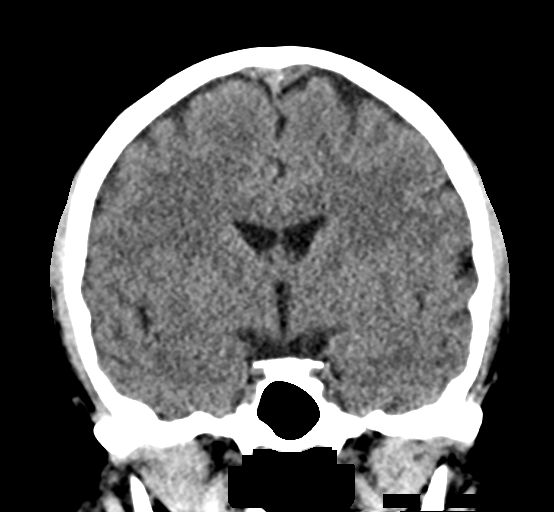

[Series 5: sag soft · sagittal · 0.31mm/px · 3 of 58 slices shown]
[im 20/58  brain]
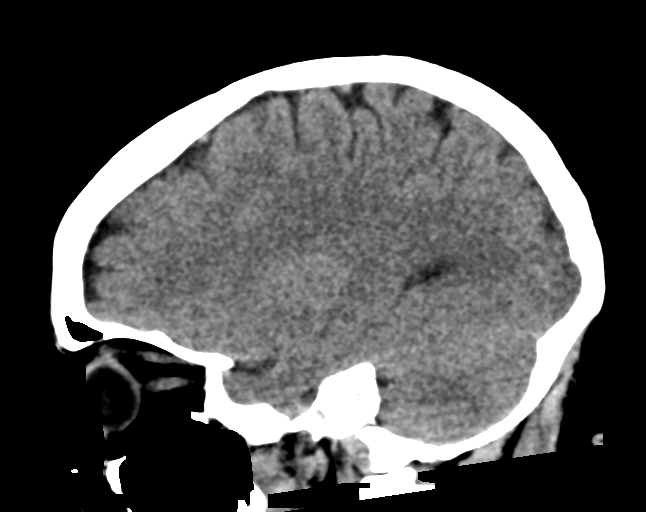
[im 29/58  brain]
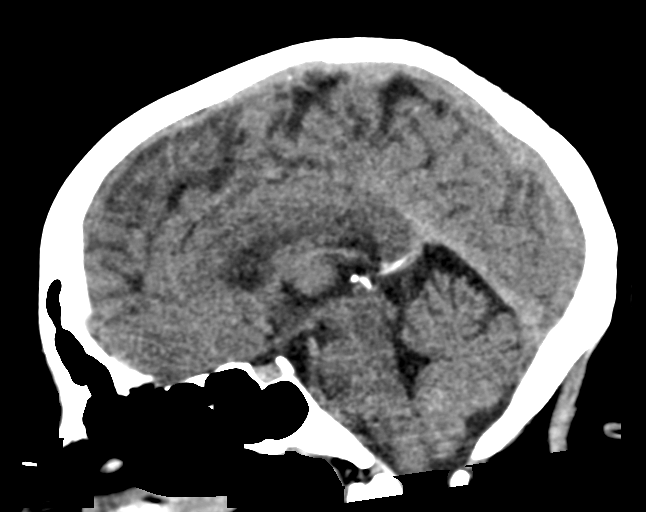
[im 39/58  brain]
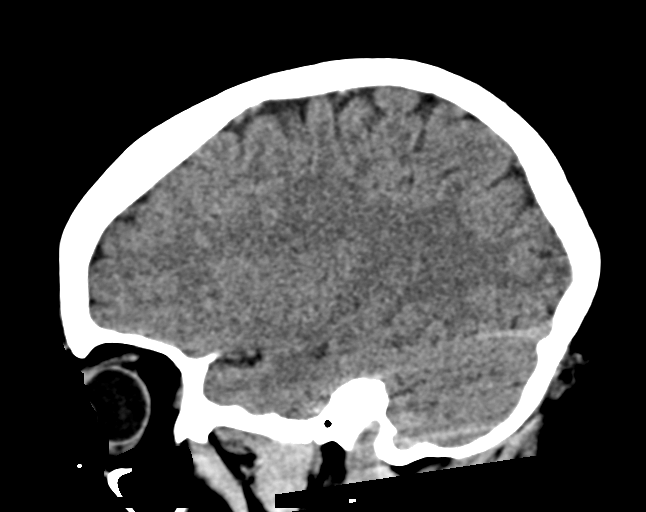

[16 of 47 positions shown; findings below may reference images not displayed]

FINDINGS: Brain: No evidence acute large vascular territory infarct. Tiny
hypodensity in the inferior left basal ganglia is favored to reflect
a dilated perivascular space given the location. No acute
hemorrhage. No hydrocephalus. No mass lesion or abnormal mass
effect.

Vascular: No hyperdense vessel identified

Skull: Normal. Negative for fracture or focal lesion.

Sinuses/Orbits: No acute finding.

Other: No mastoid effusion.
IMPRESSION: No evidence of acute intracranial abnormality.

## 2022-01-23 ENCOUNTER — Encounter: Payer: Self-pay | Admitting: Advanced Practice Midwife

## 2022-01-23 ENCOUNTER — Other Ambulatory Visit: Payer: Self-pay

## 2022-01-23 ENCOUNTER — Ambulatory Visit (INDEPENDENT_AMBULATORY_CARE_PROVIDER_SITE_OTHER): Payer: No Typology Code available for payment source | Admitting: Advanced Practice Midwife

## 2022-01-23 VITALS — BP 126/85 | HR 100 | Ht 63.0 in | Wt 293.0 lb

## 2022-01-23 DIAGNOSIS — Z3189 Encounter for other procreative management: Secondary | ICD-10-CM

## 2022-01-23 MED ORDER — PNV PRENATAL PLUS MULTIVITAMIN 27-1 MG PO TABS: 1.0000 | ORAL_TABLET | Freq: Every day | ORAL | 11 refills | Status: AC

## 2022-01-23 NOTE — Progress Notes (Signed)
[facility] Visit  ?Patient name: Monique Fox MRN [id number]  Date of birth: 10-10-1996 ? ?CC & HPI:  ?Monique Fox is a 26 y.o.  female presenting today for fertility counseling.  Has intercourse usually twice a week, more often if thought she was ovulating. Has monthly periods, only a little irregular .  Husband has not ever gotten anyone pregnant. Motivated to lose weight, has been successful w/low carb.  Has been calorie restricting for 6 months, hasn't worked.  ? ?Pertinent History Reviewed:  ?Medical & Surgical Hx:   ?Past Medical History:  ?Diagnosis Date  ? Anemia   ? Anxiety   ? Asthma   ? ?History reviewed. No pertinent surgical history. ?Family History  ?Problem Relation Age of Onset  ? Cancer Paternal Grandmother   ?     cervical, abdominal, colon  ? Breast cancer Paternal Grandmother   ? Diabetes Maternal Grandmother   ? Stroke Maternal Grandmother   ? Cancer Maternal Grandfather   ?     sinus  ? ? ?Current Outpatient Medications:  ?  escitalopram (LEXAPRO) 5 MG tablet, Take 5 mg by mouth daily., Disp: , Rfl:  ?  levocetirizine (XYZAL) 5 MG tablet, Take 5 mg by mouth at bedtime., Disp: , Rfl:  ?  Prenatal Vit-Fe Fumarate-FA (PNV PRENATAL PLUS MULTIVITAMIN) 27-1 MG TABS, Take 1 tablet by mouth daily., Disp: 30 tablet, Rfl: 11 ?  SYMBICORT 80-4.5 MCG/ACT inhaler, Inhale 2 puffs into the lungs 2 (two) times daily., Disp: , Rfl:  ?Social History: Reviewed -  reports that she has never smoked. She has never used smokeless tobacco. ? ?Review of Systems:   ?Constitutional: Negative for fever and chills ?Eyes: Negative for visual disturbances ?Respiratory: Negative for shortness of breath, dyspnea ?Cardiovascular: Negative for chest pain or palpitations  ?Gastrointestinal: Negative for vomiting, diarrhea and constipation; no abdominal pain ?Genitourinary: Negative for dysuria and urgency, vaginal irritation or itching ?Musculoskeletal: Negative for back pain, joint pain, myalgias  ?Neurological:  Negative for dizziness and headaches ? ? ? ?Objective Findings:  ? ? ?Physical Examination: ?Vitals:  ? 01/23/22 0957  ?BP: 126/85  ?Pulse: 100  ? ?General appearance - well appearing, and in no distress ?Mental status - alert, oriented to person, place, and time ?Chest:  Normal respiratory effort ?Heart - normal rate and regular rhythm ?Musculoskeletal:  Normal range of motion without pain ?Extremities:  No edema ? ?50% or more of this visit was spent in counseling and coordination of care.  15 minutes of face to face time. ? ? ?No results found for this or any previous visit (from the past 24 hour(s)).  ? ? ?Assessment & Plan:  ?A:  ? Fertility counseling ?P: ? Semen analysis ordered.  If all normal, will rx clomid  ? No follow-ups on file. ? ?Christin Fudge CNM ?01/23/2022 ?10:44 AM ? ? ? ? ? ?

## 2022-01-23 NOTE — Patient Instructions (Signed)
Clomiphene Tablets ?What is this medication? ?CLOMIPHENE (KLOE mi feen) treats irregular or absent ovulation in people trying to get pregnant. It works by Building control surveyor produce an egg (ovulation), which increases the chance of pregnancy. ?This medicine may be used for other purposes; ask your health care provider or pharmacist if you have questions. ?COMMON BRAND NAME(S): Clomid, Serophene ?What should I tell my care team before I take this medication? ?They need to know if you have any of these conditions: ?Adrenal gland disease ?Blood vessel disease or blood clots ?Cyst on the ovary ?Endometriosis ?Liver disease ?Ovarian cancer ?Pituitary gland disease ?Vaginal bleeding that has not been evaluated ?An unusual or allergic reaction to clomiphene, other medications, foods, dyes, or preservatives ?Pregnant (should not be used if you are already pregnant) ?Breast-feeding ?How should I use this medication? ?Take this medication by mouth with a glass of water. Follow the directions on the prescription label. Take exactly as directed for the exact number of days prescribed. Take your doses at regular intervals. Most women take this medication for a 5-day period, but the length of treatment may be adjusted. Your care team will give you a start date for this medication and will give you instructions on proper use. Do not take your medication more often than directed. ?Talk to your care team about the use of this medication in children. Special care may be needed. ?Overdosage: If you think you have taken too much of this medicine contact a poison control center or emergency room at once. ?NOTE: This medicine is only for you. Do not share this medicine with others. ?What if I miss a dose? ?If you miss a dose, take it as soon as you can. If it is almost time for your next dose, take only that dose. Do not take double or extra doses. ?What may interact with this medication? ?Herbal or dietary supplements, like blue cohosh,  black cohosh, chasteberry, or DHEA ?Prasterone ?This list may not describe all possible interactions. Give your health care provider a list of all the medicines, herbs, non-prescription drugs, or dietary supplements you use. Also tell them if you smoke, drink alcohol, or use illegal drugs. Some items may interact with your medicine. ?What should I watch for while using this medication? ?Make sure you understand how and when to use this medication. You need to know when you are ovulating and when to have sexual intercourse. This will increase the chance of a pregnancy. ?Visit your care team for regular checks on your progress. You may need tests to check the hormone levels in your blood or you may have to use home-urine tests to check for ovulation. Try to keep any appointments. ?Compared to other fertility treatments, this medication does not greatly increase your chances of having multiple babies. An increased chance of having twins may occur in roughly 5 out of every 100 women who take this medication. ?Stop taking this medication at once and contact your care team if you think you are pregnant. ?This medication is not for long-term use. Most women that benefit from this medication do so within the first three cycles (months). Your care team will monitor your condition. This medication is usually used for a total of 6 cycles of treatment. ?You may get drowsy or dizzy. Do not drive, use machinery, or do anything that needs mental alertness until you know how this medication affects you. Do not stand or sit up quickly. This reduces the risk of dizzy or fainting spells. ?Drinking  alcoholic beverages or smoking tobacco may decrease your chance of becoming pregnant. Limit or stop alcohol and tobacco use during your fertility treatments. ?What side effects may I notice from receiving this medication? ?Side effects that you should report to your care team as soon as possible: ?Allergic reactions--skin rash, itching,  hives, swelling of the face, lips, tongue, or throat ?Change in vision ?Ovarian hyperstimulation syndrome--stomach or pelvic pain, bloating, nausea, vomiting, diarrhea, weight gain ?Pancreatitis--severe stomach pain that spreads to your back or gets worse after eating or when touched, fever, nausea, vomiting ?Side effects that usually do not require medical attention (report to your care team if they continue or are bothersome): ?Breast pain or tenderness ?Headache ?Hot flashes ?Irregular menstrual cycles or spotting ?This list may not describe all possible side effects. Call your doctor for medical advice about side effects. You may report side effects to FDA at 1-800-FDA-1088. ?Where should I keep my medication? ?Keep out of the reach of children. ?Store at room temperature between 15 and 30 degrees C (59 and 86 degrees F). Protect from heat, light, and moisture. Throw away any unused medication after the expiration date. ?NOTE: This sheet is a summary. It may not cover all possible information. If you have questions about this medicine, talk to your doctor, pharmacist, or health care provider. ?? 2022 [employer]vier/Gold Standard (2021-02-06 00:00:00) ? ?

## 2022-06-05 ENCOUNTER — Encounter: Payer: Self-pay | Admitting: Advanced Practice Midwife

## 2022-06-09 DIAGNOSIS — F419 Anxiety disorder, unspecified: Secondary | ICD-10-CM | POA: Insufficient documentation

## 2022-06-10 ENCOUNTER — Encounter: Payer: Self-pay | Admitting: *Deleted

## 2022-06-10 ENCOUNTER — Ambulatory Visit (INDEPENDENT_AMBULATORY_CARE_PROVIDER_SITE_OTHER): Payer: No Typology Code available for payment source | Admitting: *Deleted

## 2022-06-10 DIAGNOSIS — Z3201 Encounter for pregnancy test, result positive: Secondary | ICD-10-CM

## 2022-06-10 LAB — POCT URINE PREGNANCY: Preg Test, Ur: POSITIVE — AB

## 2022-06-10 NOTE — Progress Notes (Signed)
   NURSE VISIT- PREGNANCY CONFIRMATION   SUBJECTIVE:  Monique Fox is a 26 y.o. G1P0000 female at 79w4dby certain LMP of Patient's last menstrual period was 05/02/2022. Here for pregnancy confirmation.  Home pregnancy test: positive x 2   She reports nausea.  She is taking prenatal vitamins.    OBJECTIVE:  LMP 05/02/2022   Appears well, in no apparent distress  No results found for this or any previous visit (from the past 24 hour(s)).  ASSESSMENT: Positive pregnancy test, 511w4dy LMP    PLAN: Schedule for dating ultrasound in 2-3 weeks Prenatal vitamins: continue   Nausea medicines: not currently needed   OB packet given: Yes  AmJanece Canterbury8/06/2022 10:53 AM

## 2022-06-17 ENCOUNTER — Encounter: Payer: Self-pay | Admitting: Advanced Practice Midwife

## 2022-06-27 ENCOUNTER — Inpatient Hospital Stay (HOSPITAL_COMMUNITY): Payer: No Typology Code available for payment source

## 2022-06-27 ENCOUNTER — Encounter (HOSPITAL_COMMUNITY): Payer: Self-pay

## 2022-06-27 ENCOUNTER — Inpatient Hospital Stay (HOSPITAL_COMMUNITY)
Admission: AD | Admit: 2022-06-27 | Discharge: 2022-06-27 | Disposition: A | Discharge status: Home / Self Care | Payer: No Typology Code available for payment source | Attending: Family Medicine | Admitting: Family Medicine

## 2022-06-27 DIAGNOSIS — Z3491 Encounter for supervision of normal pregnancy, unspecified, first trimester: Secondary | ICD-10-CM | POA: Diagnosis not present

## 2022-06-27 DIAGNOSIS — Z3A01 Less than 8 weeks gestation of pregnancy: Secondary | ICD-10-CM | POA: Insufficient documentation

## 2022-06-27 DIAGNOSIS — O209 Hemorrhage in early pregnancy, unspecified: Secondary | ICD-10-CM | POA: Diagnosis not present

## 2022-06-27 LAB — CBC
HCT: 41 % (ref 36.0–46.0)
Hemoglobin: 13.4 g/dL (ref 12.0–15.0)
MCH: 28.2 pg (ref 26.0–34.0)
MCHC: 32.7 g/dL (ref 30.0–36.0)
MCV: 86.3 fL (ref 80.0–100.0)
Platelets: 344 10*3/uL (ref 150–400)
RBC: 4.75 MIL/uL (ref 3.87–5.11)
RDW: 13.8 % (ref 11.5–15.5)
WBC: 13.5 10*3/uL — ABNORMAL HIGH (ref 4.0–10.5)
nRBC: 0 % (ref 0.0–0.2)

## 2022-06-27 LAB — URINALYSIS, ROUTINE W REFLEX MICROSCOPIC
Bilirubin Urine: NEGATIVE
Glucose, UA: NEGATIVE mg/dL
Ketones, ur: NEGATIVE mg/dL
Leukocytes,Ua: NEGATIVE
Nitrite: NEGATIVE
Protein, ur: NEGATIVE mg/dL
Specific Gravity, Urine: 1.011 (ref 1.005–1.030)
pH: 5 (ref 5.0–8.0)

## 2022-06-27 LAB — WET PREP, GENITAL
Clue Cells Wet Prep HPF POC: NONE SEEN
Sperm: NONE SEEN
Trich, Wet Prep: NONE SEEN
WBC, Wet Prep HPF POC: <10 (ref <10)
Yeast Wet Prep HPF POC: NONE SEEN

## 2022-06-27 LAB — ABO/RH: ABO/RH(D): O POS

## 2022-06-27 LAB — HCG, QUANTITATIVE, PREGNANCY: hCG, Beta Chain, Quant, S: 61306 m[IU]/mL — ABNORMAL HIGH (ref <5)

## 2022-06-27 NOTE — MAU Provider Note (Signed)
History     CSN: [id number]  Arrival date and time: 06/27/22 1846   Event Date/Time   First Provider Initiated Contact with Patient 06/27/22 1918      Chief Complaint  Patient presents with   Vaginal Bleeding   HPI Monique Fox is a 26 y.o. G1P0000 at 49w0dwho presents to MAU with chief complaint of vaginal spotting. Patient first noticed this problem around 06/17/2022. Initially it was very light and brown but this afternoon she felt a gush and her spotting became pink-tinged and heavier than its been. She denies pain. She denies dysuria, fever or recent illness. She is remote from sexual intercourse.   Patient has initiated care with [facility]  OB History     Gravida  1   Para  0   Term  0   Preterm  0   AB  0   Living  0      SAB  0   IAB  0   Ectopic  0   Multiple  0   Live Births  0           Past Medical History:  Diagnosis Date   Anemia    Anxiety    Asthma     Past Surgical History:  Procedure Laterality Date   NO PAST SURGERIES      Family History  Problem Relation Age of Onset   Cancer Paternal Grandmother        cervical, abdominal, colon   Breast cancer Paternal Grandmother    Diabetes Maternal Grandmother    Stroke Maternal Grandmother    Cancer Maternal Grandfather        sinus    Social History   Tobacco Use   Smoking status: Never   Smokeless tobacco: Never  Vaping Use   Vaping Use: Never used  Substance Use Topics   Alcohol use: Not Currently    Comment: rarely   Drug use: Never    Allergies:  Allergies  Allergen Reactions   Pineapple Hives and Itching    Medications Prior to Admission  Medication Sig Dispense Refill Last Dose   levocetirizine (XYZAL) 5 MG tablet Take 5 mg by mouth at bedtime.   06/26/2022   Prenatal Vit-Fe Fumarate-FA (PNV PRENATAL PLUS MULTIVITAMIN) 27-1 MG TABS Take 1 tablet by mouth daily. 30 tablet 11 06/26/2022   escitalopram (LEXAPRO) 5 MG tablet Take 5 mg by mouth daily.  Weaning off      SYMBICORT 80-4.5 MCG/ACT inhaler Inhale 2 puffs into the lungs 2 (two) times daily.       Review of Systems  Genitourinary:  Positive for vaginal bleeding.  All other systems reviewed and are negative.  Physical Exam   Temperature 99.3 F (37.4 C), temperature source Oral, resp. rate 15, last menstrual period 05/02/2022, SpO2 99 %.  Physical Exam Vitals and nursing note reviewed. Exam conducted with a chaperone present.  Constitutional:      Appearance: Normal appearance. She is not ill-appearing.  Cardiovascular:     Rate and Rhythm: Normal rate and regular rhythm.     Pulses: Normal pulses.     Heart sounds: Normal heart sounds.  Pulmonary:     Effort: Pulmonary effort is normal.     Breath sounds: Normal breath sounds.  Abdominal:     General: Abdomen is flat.  Skin:    Capillary Refill: Capillary refill takes less than 2 seconds.  Neurological:     Mental Status: She is alert  and oriented to person, place, and time.  Psychiatric:        Mood and Affect: Mood normal.        Behavior: Behavior normal.        Thought Content: Thought content normal.        Judgment: Judgment normal.     MAU Course  Procedures  MDM Orders Placed This Encounter  Procedures   Wet prep, genital   US OB LESS THAN 14 WEEKS WITH OB TRANSVAGINAL   CBC   hCG, quantitative, pregnancy   Urinalysis, Routine w reflex microscopic Urine, Clean Catch   ABO/Rh   Patient Vitals for the past 24 hrs:  Temp Temp src Resp SpO2  06/27/22 1914 99.3 F (37.4 C) Oral 15 99 %   Results for orders placed or performed during the hospital encounter of 06/27/22 (from the past 24 hour(s))  Wet prep, genital     Status: None   Collection Time: 06/27/22  6:52 PM   Specimen: Urine, Clean Catch  Result Value Ref Range   Yeast Wet Prep HPF POC NONE SEEN NONE SEEN   Trich, Wet Prep NONE SEEN NONE SEEN   Clue Cells Wet Prep HPF POC NONE SEEN NONE SEEN   WBC, Wet Prep HPF POC <10 <10    Sperm NONE SEEN   CBC     Status: Abnormal   Collection Time: 06/27/22  7:13 PM  Result Value Ref Range   WBC 13.5 (H) 4.0 - 10.5 K/uL   RBC 4.75 3.87 - 5.11 MIL/uL   Hemoglobin 13.4 12.0 - 15.0 g/dL   HCT 41.0 36.0 - 46.0 %   MCV 86.3 80.0 - 100.0 fL   MCH 28.2 26.0 - 34.0 pg   MCHC 32.7 30.0 - 36.0 g/dL   RDW 13.8 11.5 - 15.5 %   Platelets 344 150 - 400 K/uL   nRBC 0.0 0.0 - 0.2 %  ABO/Rh     Status: None   Collection Time: 06/27/22  7:13 PM  Result Value Ref Range   ABO/RH(D) O POS    No rh immune globuloin      NOT A RH IMMUNE GLOBULIN CANDIDATE, PT RH POSITIVE Performed at [facility], [place] [address], [place], [place] [address]   Urinalysis, Routine w reflex microscopic Urine, Clean Catch     Status: Abnormal   Collection Time: 06/27/22  7:20 PM  Result Value Ref Range   Color, Urine YELLOW YELLOW   APPearance CLEAR CLEAR   Specific Gravity, Urine 1.011 1.005 - 1.030   pH 5.0 5.0 - 8.0   Glucose, UA NEGATIVE NEGATIVE mg/dL   Hgb urine dipstick LARGE (A) NEGATIVE   Bilirubin Urine NEGATIVE NEGATIVE   Ketones, ur NEGATIVE NEGATIVE mg/dL   Protein, ur NEGATIVE NEGATIVE mg/dL   Nitrite NEGATIVE NEGATIVE   Leukocytes,Ua NEGATIVE NEGATIVE   RBC / HPF 6-10 0 - 5 RBC/hpf   WBC, UA 0-5 0 - 5 WBC/hpf   Bacteria, UA RARE (A) NONE SEEN   Squamous Epithelial / LPF 0-5 0 - 5   Mucus PRESENT    US OB LESS THAN 14 WEEKS WITH OB TRANSVAGINAL  Result Date: 06/27/2022 CLINICAL DATA:  Vaginal bleeding. EXAM: OBSTETRIC <14 WK [place] AND TRANSVAGINAL OB US TECHNIQUE: Both transabdominal and transvaginal ultrasound examinations were performed for complete evaluation of the gestation as well as the maternal uterus, adnexal regions, and pelvic cul-de-sac. Transvaginal technique was performed to assess early pregnancy. COMPARISON:  None Available.  FINDINGS: Intrauterine gestational sac: Single Yolk sac:  Visualized. Embryo:  Visualized. Cardiac Activity: Visualized. Heart Rate: 125 bpm CRL:   7.6 mm   6 w   5 d                  [place] [facility]: February 15, 2023 Subchorionic hemorrhage:  None visualized. Maternal uterus/adnexae: The right ovary measures 2.9 cm x 1.9 cm x 1.9 cm and is normal in appearance. The left ovary measures 2.8 cm x 3.0 cm x 2.5 cm and is normal in appearance. No pelvic free fluid is seen. IMPRESSION: Single, viable intrauterine pregnancy at approximately 6 weeks and 5 days gestation by ultrasound evaluation. Electronically Signed   By: Virgina Norfolk M.D.   On: 06/27/2022 20:09     Assessment and Plan  --26 y.o. G1P0000 with confirmed SIUP measuring [redacted]w[redacted]d--Hgb 13.4 --Blood type O POS, Rhogam not indicated --Discharge home in stable condition with first trimester precautions  SDarlina Rumpf MHerculaneum MSN, CNM 06/27/2022, 8:28 PM

## 2022-06-27 NOTE — Discharge Instructions (Signed)

## 2022-06-27 NOTE — MAU Note (Signed)
...  Monique Fox is a 26 y.o. at 55w0dhere in MAU reporting: Around 1630 she was walking out of work and felt a small gush of fluids. She reports she went to the restroom and noted bright red vaginal bleeding and small stringy blood clots. She reports she went to the restroom here in MAU and the bleeding is now light pink and small in amount. She denies any current pain. She reports this morning she felt a pulling sensation in her lower abdomen "almost in my pubic area." Denies vaginal discharge prior to the bleeding. Denies vaginal odors and vaginal itching.   NOB September 6th with [facility].  Onset of complaint: Today at 1630 Pain score: Denies pain.  Lab orders placed from triage:  UA

## 2022-06-30 LAB — GC/CHLAMYDIA PROBE AMP (~~LOC~~) NOT AT ARMC
Chlamydia: NEGATIVE
Comment: NEGATIVE
Comment: NORMAL
Neisseria Gonorrhea: NEGATIVE

## 2022-07-09 ENCOUNTER — Other Ambulatory Visit: Payer: No Typology Code available for payment source

## 2022-07-13 ENCOUNTER — Encounter: Payer: Self-pay | Admitting: Women's Health

## 2022-07-14 ENCOUNTER — Other Ambulatory Visit: Payer: Self-pay | Admitting: Women's Health

## 2022-07-14 MED ORDER — BONJESTA 20-20 MG PO TBCR: 1.0000 | EXTENDED_RELEASE_TABLET | Freq: Every day | ORAL | 8 refills | Status: DC

## 2022-07-16 ENCOUNTER — Other Ambulatory Visit: Payer: Self-pay | Admitting: Adult Health

## 2022-07-16 MED ORDER — PROMETHAZINE HCL 25 MG PO TABS: 25.0000 mg | ORAL_TABLET | Freq: Four times a day (QID) | ORAL | 1 refills | Status: DC | PRN

## 2022-07-16 NOTE — Progress Notes (Signed)
Rx phenergan at [facility]

## 2022-08-01 ENCOUNTER — Encounter: Payer: Self-pay | Admitting: Women's Health

## 2022-08-01 ENCOUNTER — Other Ambulatory Visit: Payer: Self-pay | Admitting: Obstetrics & Gynecology

## 2022-08-01 DIAGNOSIS — Z3682 Encounter for antenatal screening for nuchal translucency: Secondary | ICD-10-CM

## 2022-08-01 DIAGNOSIS — Z3400 Encounter for supervision of normal first pregnancy, unspecified trimester: Secondary | ICD-10-CM | POA: Insufficient documentation

## 2022-08-04 ENCOUNTER — Ambulatory Visit: Payer: No Typology Code available for payment source | Admitting: *Deleted

## 2022-08-04 ENCOUNTER — Ambulatory Visit (INDEPENDENT_AMBULATORY_CARE_PROVIDER_SITE_OTHER): Payer: No Typology Code available for payment source

## 2022-08-04 ENCOUNTER — Encounter: Payer: Self-pay | Admitting: Women's Health

## 2022-08-04 ENCOUNTER — Ambulatory Visit (INDEPENDENT_AMBULATORY_CARE_PROVIDER_SITE_OTHER): Payer: No Typology Code available for payment source | Admitting: Women's Health

## 2022-08-04 VITALS — BP 115/70 | HR 87 | Wt 272.4 lb

## 2022-08-04 DIAGNOSIS — Z3A12 12 weeks gestation of pregnancy: Secondary | ICD-10-CM | POA: Diagnosis not present

## 2022-08-04 DIAGNOSIS — Z131 Encounter for screening for diabetes mellitus: Secondary | ICD-10-CM

## 2022-08-04 DIAGNOSIS — Z6841 Body Mass Index (BMI) 40.0 and over, adult: Secondary | ICD-10-CM

## 2022-08-04 DIAGNOSIS — Z3682 Encounter for antenatal screening for nuchal translucency: Secondary | ICD-10-CM | POA: Diagnosis not present

## 2022-08-04 DIAGNOSIS — Z3402 Encounter for supervision of normal first pregnancy, second trimester: Secondary | ICD-10-CM

## 2022-08-04 DIAGNOSIS — Z3401 Encounter for supervision of normal first pregnancy, first trimester: Secondary | ICD-10-CM

## 2022-08-04 LAB — POCT URINALYSIS DIPSTICK OB
Blood, UA: NEGATIVE
Glucose, UA: NEGATIVE
Ketones, UA: NEGATIVE
Nitrite, UA: NEGATIVE
POC,PROTEIN,UA: NEGATIVE

## 2022-08-04 LAB — OB RESULTS CONSOLE HIV ANTIBODY (ROUTINE TESTING): HIV: NONREACTIVE

## 2022-08-04 LAB — HEPATITIS C ANTIBODY: HCV Ab: NEGATIVE

## 2022-08-04 LAB — OB RESULTS CONSOLE HEPATITIS B SURFACE ANTIGEN: Hepatitis B Surface Ag: NEGATIVE

## 2022-08-04 LAB — OB RESULTS CONSOLE RPR: RPR: NONREACTIVE

## 2022-08-04 LAB — OB RESULTS CONSOLE RUBELLA ANTIBODY, IGM: Rubella: IMMUNE

## 2022-08-04 MED ORDER — ASPIRIN 81 MG PO TBEC: 81.0000 mg | DELAYED_RELEASE_TABLET | Freq: Every day | ORAL | 3 refills | Status: DC

## 2022-08-04 NOTE — Patient Instructions (Signed)
Monique Fox, thank you for choosing our office today! We appreciate the opportunity to meet your healthcare needs. You may receive a short survey by mail, e-mail, or through MyChart. If you are happy with your care we would appreciate if you could take just a few minutes to complete the survey questions. We read all of your comments and take your feedback very seriously. Thank you again for choosing our office.  [facility] Team at [facility]  [facility] ([address] [place], [place] [address]) Entrance C, located off of [address] Free 24/7 valet parking   Nausea & Vomiting Have saltine crackers or pretzels by your bed and eat a few bites before you raise your head out of bed in the morning Eat small frequent meals throughout the day instead of large meals Drink plenty of fluids throughout the day to stay hydrated, just don't drink a lot of fluids with your meals.  This can make your stomach fill up faster making you feel sick Do not brush your teeth right after you eat Products with real ginger are good for nausea, like ginger ale and ginger hard candy Make sure it says made with real ginger! Sucking on sour candy like lemon heads is also good for nausea If your prenatal vitamins make you nauseated, take them at night so you will sleep through the nausea Sea Bands If you feel like you need medicine for the nausea & vomiting please let us know If you are unable to keep any fluids or food down please let us know   Constipation Drink plenty of fluid, preferably water, throughout the day Eat foods high in fiber such as fruits, vegetables, and grains Exercise, such as walking, is a good way to keep your bowels regular Drink warm fluids, especially warm prune juice, or decaf coffee Eat a 1/2 cup of real oatmeal (not instant), 1/2 cup applesauce, and 1/2-1 cup warm prune juice every day If needed, you may take Colace (docusate sodium) stool softener  once or twice a day to help keep the stool soft.  If you still are having problems with constipation, you may take Miralax once daily as needed to help keep your bowels regular.   Home Blood Pressure Monitoring for Patients   Your provider has recommended that you check your blood pressure (BP) at least once a week at home. If you do not have a blood pressure cuff at home, one will be provided for you. Contact your provider if you have not received your monitor within 1 week.   Helpful Tips for Accurate Home Blood Pressure Checks  Don't smoke, exercise, or drink caffeine 30 minutes before checking your BP Use the restroom before checking your BP (a full bladder can raise your pressure) Relax in a comfortable upright chair Feet on the ground Left arm resting comfortably on a flat surface at the level of your heart Legs uncrossed Back supported Sit quietly and don't talk Place the cuff on your bare arm Adjust snuggly, so that only two fingertips can fit between your skin and the top of the cuff Check 2 readings separated by at least one minute Keep a log of your BP readings For a visual, please reference this diagram: http://ccnc.care/bpdiagram  Provider Name: [facility]     Phone: [phone]  Zone 1: ALL CLEAR  Continue to monitor your symptoms:  BP reading is less than 140 (top number) or less than 90 (bottom   number)  No right upper stomach pain No headaches or seeing spots No feeling nauseated or throwing up No swelling in face and hands  Zone 2: CAUTION Call your doctor's office for any of the following:  BP reading is greater than 140 (top number) or greater than 90 (bottom number)  Stomach pain under your ribs in the middle or right side Headaches or seeing spots Feeling nauseated or throwing up Swelling in face and hands  Zone 3: EMERGENCY  Seek immediate medical care if you have any of the following:  BP reading is greater than160 (top number) or greater than  110 (bottom number) Severe headaches not improving with Tylenol Serious difficulty catching your breath Any worsening symptoms from Zone 2    First Trimester of Pregnancy The first trimester of pregnancy is from week 1 until the end of week 12 (months 1 through 3). A week after a sperm fertilizes an egg, the egg will implant on the wall of the uterus. This embryo will begin to develop into a baby. Genes from you and your partner are forming the baby. The female genes determine whether the baby is a boy or a girl. At 6-8 weeks, the eyes and face are formed, and the heartbeat can be seen on ultrasound. At the end of 12 weeks, all the baby's organs are formed.  Now that you are pregnant, you will want to do everything you can to have a healthy baby. Two of the most important things are to get good prenatal care and to follow your health care provider's instructions. Prenatal care is all the medical care you receive before the baby's birth. This care will help prevent, find, and treat any problems during the pregnancy and childbirth. BODY CHANGES Your body goes through many changes during pregnancy. The changes vary from woman to woman.  You may gain or lose a couple of pounds at first. You may feel sick to your stomach (nauseous) and throw up (vomit). If the vomiting is uncontrollable, call your health care provider. You may tire easily. You may develop headaches that can be relieved by medicines approved by your health care provider. You may urinate more often. Painful urination may mean you have a bladder infection. You may develop heartburn as a result of your pregnancy. You may develop constipation because certain hormones are causing the muscles that push waste through your intestines to slow down. You may develop hemorrhoids or swollen, bulging veins (varicose veins). Your breasts may begin to grow larger and become tender. Your nipples may stick out more, and the tissue that surrounds them  (areola) may become darker. Your gums may bleed and may be sensitive to brushing and flossing. Dark spots or blotches (chloasma, mask of pregnancy) may develop on your face. This will likely fade after the baby is born. Your menstrual periods will stop. You may have a loss of appetite. You may develop cravings for certain kinds of food. You may have changes in your emotions from day to day, such as being excited to be pregnant or being concerned that something may go wrong with the pregnancy and baby. You may have more vivid and strange dreams. You may have changes in your hair. These can include thickening of your hair, rapid growth, and changes in texture. Some women also have hair loss during or after pregnancy, or hair that feels dry or thin. Your hair will most likely return to normal after your baby is born. WHAT TO EXPECT AT YOUR PRENATAL   VISITS During a routine prenatal visit: You will be weighed to make sure you and the baby are growing normally. Your blood pressure will be taken. Your abdomen will be measured to track your baby's growth. The fetal heartbeat will be listened to starting around week 10 or 12 of your pregnancy. Test results from any previous visits will be discussed. Your health care provider may ask you: How you are feeling. If you are feeling the baby move. If you have had any abnormal symptoms, such as leaking fluid, bleeding, severe headaches, or abdominal cramping. If you have any questions. Other tests that may be performed during your first trimester include: Blood tests to find your blood type and to check for the presence of any previous infections. They will also be used to check for low iron levels (anemia) and Rh antibodies. Later in the pregnancy, blood tests for diabetes will be done along with other tests if problems develop. Urine tests to check for infections, diabetes, or protein in the urine. An ultrasound to confirm the proper growth and development  of the baby. An amniocentesis to check for possible genetic problems. Fetal screens for spina bifida and Down syndrome. You may need other tests to make sure you and the baby are doing well. HOME CARE INSTRUCTIONS  Medicines Follow your health care provider's instructions regarding medicine use. Specific medicines may be either safe or unsafe to take during pregnancy. Take your prenatal vitamins as directed. If you develop constipation, try taking a stool softener if your health care provider approves. Diet Eat regular, well-balanced meals. Choose a variety of foods, such as meat or vegetable-based protein, fish, milk and low-fat dairy products, vegetables, fruits, and whole grain breads and cereals. Your health care provider will help you determine the amount of weight gain that is right for you. Avoid raw meat and uncooked cheese. These carry germs that can cause birth defects in the baby. Eating four or five small meals rather than three large meals a day may help relieve nausea and vomiting. If you start to feel nauseous, eating a few soda crackers can be helpful. Drinking liquids between meals instead of during meals also seems to help nausea and vomiting. If you develop constipation, eat more high-fiber foods, such as fresh vegetables or fruit and whole grains. Drink enough fluids to keep your urine clear or pale yellow. Activity and Exercise Exercise only as directed by your health care provider. Exercising will help you: Control your weight. Stay in shape. Be prepared for labor and delivery. Experiencing pain or cramping in the lower abdomen or low back is a good sign that you should stop exercising. Check with your health care provider before continuing normal exercises. Try to avoid standing for long periods of time. Move your legs often if you must stand in one place for a long time. Avoid heavy lifting. Wear low-heeled shoes, and practice good posture. You may continue to have sex  unless your health care provider directs you otherwise. Relief of Pain or Discomfort Wear a good support bra for breast tenderness.   Take warm sitz baths to soothe any pain or discomfort caused by hemorrhoids. Use hemorrhoid cream if your health care provider approves.   Rest with your legs elevated if you have leg cramps or low back pain. If you develop varicose veins in your legs, wear support hose. Elevate your feet for 15 minutes, 3-4 times a day. Limit salt in your diet. Prenatal Care Schedule your prenatal visits by the   twelfth week of pregnancy. They are usually scheduled monthly at first, then more often in the last 2 months before delivery. Write down your questions. Take them to your prenatal visits. Keep all your prenatal visits as directed by your health care provider. Safety Wear your seat belt at all times when driving. Make a list of emergency phone numbers, including numbers for family, friends, the hospital, and police and fire departments. General Tips Ask your health care provider for a referral to a local prenatal education class. Begin classes no later than at the beginning of month 6 of your pregnancy. Ask for help if you have counseling or nutritional needs during pregnancy. Your health care provider can offer advice or refer you to specialists for help with various needs. Do not use hot tubs, steam rooms, or saunas. Do not douche or use tampons or scented sanitary pads. Do not cross your legs for long periods of time. Avoid cat litter boxes and soil used by cats. These carry germs that can cause birth defects in the baby and possibly loss of the fetus by miscarriage or stillbirth. Avoid all smoking, herbs, alcohol, and medicines not prescribed by your health care provider. Chemicals in these affect the formation and growth of the baby. Schedule a dentist appointment. At home, brush your teeth with a soft toothbrush and be gentle when you floss. SEEK MEDICAL CARE IF:   You have dizziness. You have mild pelvic cramps, pelvic pressure, or nagging pain in the abdominal area. You have persistent nausea, vomiting, or diarrhea. You have a bad smelling vaginal discharge. You have pain with urination. You notice increased swelling in your face, hands, legs, or ankles. SEEK IMMEDIATE MEDICAL CARE IF:  You have a fever. You are leaking fluid from your vagina. You have spotting or bleeding from your vagina. You have severe abdominal cramping or pain. You have rapid weight gain or loss. You vomit blood or material that looks like coffee grounds. You are exposed to German measles and have never had them. You are exposed to fifth disease or chickenpox. You develop a severe headache. You have shortness of breath. You have any kind of trauma, such as from a fall or a car accident. Document Released: 10/14/2001 Document Revised: 03/06/2014 Document Reviewed: 08/30/2013 Exit[employer] Patient Information 2015 [employer]. This information is not intended to replace advice given to you by your health care provider. Make sure you discuss any questions you have with your health care provider.  

## 2022-08-04 NOTE — Progress Notes (Signed)
[place] 12+1 wks,measurements c/w dates,subchorionic hemorrhage 2.7 x .7 x 2.6 cm,normal ovaries,anterior placenta,NB present,NT 1.4 mm,FHR 170 bpm,CRL 59.37 mm

## 2022-08-04 NOTE — Progress Notes (Signed)
INITIAL OBSTETRICAL VISIT Patient name: Monique Fox MRN [id number]  Date of birth: [birth year] Chief Complaint:   Initial Prenatal Visit  History of Present Illness:   Sharea Guinther is a 26 y.o. G35P0000 Caucasian female at 71w1dby [place] 6 weeks with an Estimated Date of Delivery: 02/15/23 being seen today for her initial obstetrical visit.   Patient's last menstrual period was 05/02/2022. Her obstetrical history is significant for primigravida.   Today she reports round ligament pain. 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 Results were: negative per pt report at her PCP     08/04/2022    9:27 AM 01/23/2022   10:02 AM 02/24/2017    9:08 AM  Depression screen PHQ 2/9  Decreased Interest 0 0 0  Down, Depressed, Hopeless 0 0 0  PHQ - 2 Score 0 0 0  Altered sleeping 0 1   Tired, decreased energy 1 1   Change in appetite 0 1   Feeling bad or failure about yourself  0 1   Trouble concentrating 0 0   Moving slowly or fidgety/restless 0 0   Suicidal thoughts 0 0   PHQ-9 Score 1 4         08/04/2022    9:28 AM 01/23/2022   10:03 AM  GAD 7 : Generalized Anxiety Score  Nervous, Anxious, on Edge 0 2  Control/stop worrying 0 2  Worry too much - different things 0 2  Trouble relaxing 0 2  Restless 0 1  Easily annoyed or irritable 0 1  Afraid - awful might happen 0 0  Total GAD 7 Score 0 10     Review of Systems:   Pertinent items are noted in HPI Denies cramping/contractions, leakage of fluid, vaginal bleeding, abnormal vaginal discharge w/ itching/odor/irritation, headaches, visual changes, shortness of breath, chest pain, abdominal pain, severe nausea/vomiting, or problems with urination or bowel movements unless otherwise stated above.  Pertinent History Reviewed:  Reviewed past medical,surgical, social, obstetrical and family history.  Reviewed problem list, medications and allergies. OB History  Gravida Para Term  Preterm AB Living  1 0 0 0 0 0  SAB IAB Ectopic Multiple Live Births  0 0 0 0 0    # Outcome Date GA Lbr Len/2nd Weight Sex Delivery Anes PTL Lv  1 Current            Physical Assessment:   Vitals:   08/04/22 1026  BP: 115/70  Pulse: 87  Weight: 272 lb 6.4 oz (123.6 kg)  Body mass index is 48.25 kg/m.       Physical Examination:  General appearance - well appearing, and in no distress  Mental status - alert, oriented to person, place, and time  Psych:  She has a normal mood and affect  Skin - warm and dry, normal color, no suspicious lesions noted  Chest - effort normal, all lung fields clear to auscultation bilaterally  Heart - normal rate and regular rhythm  Abdomen - soft, nontender  Extremities:  No swelling or varicosities noted  Thin prep pap is not done   Chaperone: N/A    TODAY'S NT US[place]2+1 wks,measurements c/w dates,subchorionic hemorrhage 2.7 x .7 x 2.6 cm,normal ovaries,anterior placenta,NB present,NT 1.4 mm,FHR 170 bpm,CRL 59.37 mm  Results for orders placed or performed in visit on 08/04/22 (from the past 24 hour(s))  POC Urinalysis Dipstick OB   Collection Time: 08/04/22 10:58 AM  Result Value Ref Range   Color, UA     Clarity, UA     Glucose, UA Negative Negative   Bilirubin, UA     Ketones, UA neg    Spec Grav, UA     Blood, UA neg    pH, UA     POC,PROTEIN,UA Negative Negative, Trace, Small (1+), Moderate (2+), Large (3+), 4+   Urobilinogen, UA     Nitrite, UA neg    Leukocytes, UA Trace (A) Negative   Appearance     Odor      Assessment & Plan:  1) Low-Risk Pregnancy G1P0000 at 58w1dwith an Estimated Date of Delivery: 02/15/23   2) Initial OB visit  3) Small [place]2.7cm> no VB since 6wk MAU visit, Rh+  4) BMI 48  Meds:  Meds ordered this encounter  Medications   aspirin EC 81 MG tablet    Sig: Take 1 tablet (81 mg total) by mouth daily. Swallow whole.    Dispense:  90 tablet    Refill:  3    Order Specific Question:   Supervising  Provider    Answer:   ETania AdeH [[id number]]    Initial labs obtained Continue prenatal vitamins Reviewed n/v relief measures and warning s/s to report Encouraged well-balanced diet Genetic & carrier screening discussed: requests Panorama, NT/IT, and Horizon  Ultrasound discussed; fetal survey: requested CCNC completed> form faxed if has or is planning to apply for medicaid The nature of [place] [facility] [facility] multiple MDs and other Advanced Practice Providers was explained to patient; also emphasized that fellows, residents, and students are part of our team. Does have home bp cuff. Office bp cuff given: no. Rx sent: n/a. Check bp weekly, let u[place]oreaknow if consistently >140/90.   Indications for ASA therapy (per uptodate) OR Two or more of the following: Nulliparity Yes Obesity (BMI>30 kg/m2) Yes  Indications for early A1C (per uptodate) BMI >=25 (>=23 in Asian women) AND one of the following Other clinical condition associated with insulin resistance (eg, severe obesity, acanthosis nigricans) Yes   Follow-up: Return in about 4 weeks (around 09/01/2022) for LROB, 2nd IT, CNM, in person; then 7wks anatomy u/s & LROB w/ CNM.   Orders Placed This Encounter  Procedures   Urine Culture   GC/Chlamydia Probe Amp   Integrated 1   CBC/D/Plt+RPR+Rh+ABO+RubIgG...   Panorama Prenatal Test Full Panel   HORIZON CUSTOM   Hemoglobin A1c   POC Urinalysis Dipstick OB    KRoma SchanzCNM, [facility]10/12/2021 11:10 AM

## 2022-08-05 LAB — HEMOGLOBIN A1C
Est. average glucose Bld gHb Est-mCnc: 114 mg/dL
Hgb A1c MFr Bld: 5.6 % (ref 4.8–5.6)

## 2022-08-06 LAB — CBC/D/PLT+RPR+RH+ABO+RUBIGG...
Antibody Screen: NEGATIVE
Basophils Absolute: 0.1 10*3/uL (ref 0.0–0.2)
Basos: 0 %
EOS (ABSOLUTE): 0.1 10*3/uL (ref 0.0–0.4)
Eos: 1 %
HCV Ab: NONREACTIVE
HIV Screen 4th Generation wRfx: NONREACTIVE
Hematocrit: 43.3 % (ref 34.0–46.6)
Hemoglobin: 13.9 g/dL (ref 11.1–15.9)
Hepatitis B Surface Ag: NEGATIVE
Immature Grans (Abs): 0 10*3/uL (ref 0.0–0.1)
Immature Granulocytes: 0 %
Lymphocytes Absolute: 2 10*3/uL (ref 0.7–3.1)
Lymphs: 16 %
MCH: 28.4 pg (ref 26.6–33.0)
MCHC: 32.1 g/dL (ref 31.5–35.7)
MCV: 89 fL (ref 79–97)
Monocytes Absolute: 0.6 10*3/uL (ref 0.1–0.9)
Monocytes: 5 %
Neutrophils Absolute: 9.8 10*3/uL — ABNORMAL HIGH (ref 1.4–7.0)
Neutrophils: 78 %
Platelets: 299 10*3/uL (ref 150–450)
RBC: 4.89 x10E6/uL (ref 3.77–5.28)
RDW: 14.5 % (ref 11.7–15.4)
RPR Ser Ql: NONREACTIVE
Rh Factor: POSITIVE
Rubella Antibodies, IGG: 10.3 index (ref >0.99)
WBC: 12.6 10*3/uL — ABNORMAL HIGH (ref 3.4–10.8)

## 2022-08-06 LAB — URINE CULTURE

## 2022-08-06 LAB — INTEGRATED 1
Crown Rump Length: 59.4 mm
Gest. Age on Collection Date: 12.3 weeks
Maternal Age at EDD: 28 yr
Nuchal Translucency (NT): 1.4 mm
Number of Fetuses: 1
PAPP-A Value: 204.6 ng/mL

## 2022-08-06 LAB — GC/CHLAMYDIA PROBE AMP
Chlamydia trachomatis, NAA: NEGATIVE
Neisseria Gonorrhoeae by PCR: NEGATIVE

## 2022-08-06 LAB — OB RESULTS CONSOLE GC/CHLAMYDIA
Chlamydia: NEGATIVE
Neisseria Gonorrhea: NEGATIVE

## 2022-08-06 LAB — HCV INTERPRETATION

## 2022-08-10 ENCOUNTER — Encounter: Payer: Self-pay | Admitting: Women's Health

## 2022-08-10 LAB — PANORAMA PRENATAL TEST FULL PANEL:PANORAMA TEST PLUS 5 ADDITIONAL MICRODELETIONS: FETAL FRACTION: 5.3

## 2022-08-17 LAB — HORIZON CUSTOM: REPORT SUMMARY: NEGATIVE

## 2022-08-18 ENCOUNTER — Encounter: Payer: Self-pay | Admitting: Women's Health

## 2022-08-29 ENCOUNTER — Encounter: Payer: Self-pay | Admitting: Women's Health

## 2022-09-01 ENCOUNTER — Ambulatory Visit (INDEPENDENT_AMBULATORY_CARE_PROVIDER_SITE_OTHER): Payer: No Typology Code available for payment source | Admitting: Women's Health

## 2022-09-01 ENCOUNTER — Encounter: Payer: Self-pay | Admitting: Women's Health

## 2022-09-01 VITALS — BP 123/77 | HR 74 | Wt 271.0 lb

## 2022-09-01 DIAGNOSIS — Z363 Encounter for antenatal screening for malformations: Secondary | ICD-10-CM

## 2022-09-01 DIAGNOSIS — Z111 Encounter for screening for respiratory tuberculosis: Secondary | ICD-10-CM

## 2022-09-01 DIAGNOSIS — Z3402 Encounter for supervision of normal first pregnancy, second trimester: Secondary | ICD-10-CM

## 2022-09-01 DIAGNOSIS — Z3401 Encounter for supervision of normal first pregnancy, first trimester: Secondary | ICD-10-CM

## 2022-09-01 DIAGNOSIS — Z3A16 16 weeks gestation of pregnancy: Secondary | ICD-10-CM

## 2022-09-01 NOTE — Progress Notes (Signed)
LOW-RISK PREGNANCY VISIT Patient name: Monique Fox MRN [id number]  Date of birth: Apr 09, 1996 Chief Complaint:   Routine Prenatal Visit  History of Present Illness:   Monique Fox is a 26 y.o. G49P0000 female at 39w1dwith an Estimated Date of Delivery: 02/15/23 being seen today for ongoing management of a low-risk pregnancy.   Today she reports  breasts leaking. Needs TB quantiferon drawn for school, wants today w/ labs if possible . Contractions: Not present. Vag. Bleeding: None.  Movement: Present. denies leaking of fluid.     08/04/2022    9:27 AM 01/23/2022   10:02 AM 02/24/2017    9:08 AM  Depression screen PHQ 2/9  Decreased Interest 0 0 0  Down, Depressed, Hopeless 0 0 0  PHQ - 2 Score 0 0 0  Altered sleeping 0 1   Tired, decreased energy 1 1   Change in appetite 0 1   Feeling bad or failure about yourself  0 1   Trouble concentrating 0 0   Moving slowly or fidgety/restless 0 0   Suicidal thoughts 0 0   PHQ-9 Score 1 4         08/04/2022    9:28 AM 01/23/2022   10:03 AM  GAD 7 : Generalized Anxiety Score  Nervous, Anxious, on Edge 0 2  Control/stop worrying 0 2  Worry too much - different things 0 2  Trouble relaxing 0 2  Restless 0 1  Easily annoyed or irritable 0 1  Afraid - awful might happen 0 0  Total GAD 7 Score 0 10      Review of Systems:   Pertinent items are noted in HPI Denies abnormal vaginal discharge w/ itching/odor/irritation, headaches, visual changes, shortness of breath, chest pain, abdominal pain, severe nausea/vomiting, or problems with urination or bowel movements unless otherwise stated above. Pertinent History Reviewed:  Reviewed past medical,surgical, social, obstetrical and family history.  Reviewed problem list, medications and allergies. Physical Assessment:   Vitals:   09/01/22 0841  BP: 123/77  Pulse: 74  Weight: 271 lb (122.9 kg)  Body mass index is 48.01 kg/m.        Physical Examination:   General appearance: Well  appearing, and in no distress  Mental status: Alert, oriented to person, place, and time  Skin: Warm & dry  Cardiovascular: Normal heart rate noted  Respiratory: Normal respiratory effort, no distress  Abdomen: Soft, gravid, nontender  Pelvic: Cervical exam deferred         Extremities: Edema: None  Fetal Status: Fetal Heart Rate (bpm): 154   Movement: Present    Chaperone: N/A   No results found for this or any previous visit (from the past 24 hour(s)).  Assessment & Plan:  1) Low-risk pregnancy G1P0000 at 140w1dith an Estimated Date of Delivery: 02/15/23   2) TB screening, for school per pt request   Meds: No orders of the defined types were placed in this encounter.  Labs/procedures today: 2nd IT and TB quantiferon  Plan:  Continue routine obstetrical care  Next visit: prefers will be in person for u/s     Reviewed: Preterm labor symptoms and general obstetric precautions including but not limited to vaginal bleeding, contractions, leaking of fluid and fetal movement were reviewed in detail with the patient.  All questions were answered. Does not have home bp cuff. Office bp cuff given: no-none available Check bp weekly at work, let usKoreanow if consistently >140 and/or >90.  Follow-up: Return for  As scheduled; please get last pap from PCP.  Future Appointments  Date Time Provider [place]  09/22/2022  9:15 AM [facility] [place] [facility] None  09/22/2022 10:10 AM Roma Schanz, CNM [facility]    Orders Placed This Encounter  Procedures   US OB Comp + 14 Wk   INTEGRATED 2   QuantiFERON-TB Gold Plus   [place], [facility] 09/01/2022 9:07 AM

## 2022-09-01 NOTE — Patient Instructions (Signed)
Monique Fox, thank you for choosing our office today! We appreciate the opportunity to meet your healthcare needs. You may receive a short survey by mail, e-mail, or through [employer]. If you are happy with your care we would appreciate if you could take just a few minutes to complete the survey questions. We read all of your comments and take your feedback very seriously. Thank you again for choosing our office.  Center for [employer] Team at [place] at [facility] ([place], [place] [address]) Entrance C, located off of [address] parking  Go to [email or web address] to register for FREE online childbirth classes  Call the office [phone]) or go to [facility] if: You begin to severe cramping Your water breaks.  Sometimes it is a big gush of fluid, sometimes it is just a trickle that keeps getting your panties wet or running down your legs You have vaginal bleeding.  It is normal to have a small amount of spotting if your cervix was checked.   [facility] Pediatricians/Family Doctors [facility]): [address] [place], [place] [facility]: [address] [place], [phone]                [facility]): [place], [phone] (call to ask if accepting patients) [facility] Department: [address], [place], [place] Pediatricians/Family Doctors [facility]): [place]. [place], [address], [facility]: [address] [place], [facility]: [place], [place] [facility]): [phone] [facility]: [address], [place]: [address] [facility]: [phone], [phone]  Home Blood Pressure Monitoring for Patients   Your provider has recommended that you check your blood pressure (BP) at least once a week at home. If you do not have a blood pressure cuff at home, one will be provided for you. Contact your provider if you have not received your monitor within 1 week.   Helpful Tips for Accurate Home Blood Pressure Checks  Don't smoke, exercise, or drink caffeine 30 minutes before checking your BP Use the restroom before checking your BP (a full bladder can raise your pressure) Relax in a comfortable upright chair Feet on the ground Left arm resting comfortably on a flat surface at the level of your heart Legs uncrossed Back supported Sit quietly and don't talk Place the cuff on your bare arm Adjust snuggly, so that only two fingertips can fit between your skin and the top of the cuff Check 2 readings separated by at least one minute Keep a log of your BP readings For a visual, please reference this diagram: http://ccnc.care/bpdiagram  Provider Name: [facility]     Phone: [phone]  Zone 1: ALL CLEAR  Continue to monitor your symptoms:  BP reading is less than 140 (top number) or less than 90 (bottom number)  No right upper stomach pain No headaches or seeing spots No feeling nauseated or throwing up No swelling in face and hands  Zone 2: CAUTION Call your doctor's office for any of the following:  BP reading is greater than 140 (top number) or greater than  90 (bottom number)  Stomach pain under your ribs in the middle or right side Headaches or seeing spots Feeling nauseated or throwing up Swelling in face and hands  Zone 3: EMERGENCY  Seek immediate medical care if you have any of the following:  BP reading is greater than160 (top number) or greater than 110 (bottom number) Severe headaches not improving with Tylenol Serious difficulty catching your breath Any worsening symptoms from  Zone 2     Second Trimester of Pregnancy The second trimester is from week 14 through week 27 (months 4 through 6). The second trimester is often a time when you feel your best. Your body has adjusted to being pregnant, and you begin to feel better physically. Usually, morning sickness has lessened or quit completely, you may have more energy, and you may have an increase in appetite. The second trimester is also a time when the fetus is growing rapidly. At the end of the sixth month, the fetus is about 9 inches long and weighs about 1 pounds. You will likely begin to feel the baby move (quickening) between 16 and 20 weeks of pregnancy. Body changes during your second trimester Your body continues to go through many changes during your second trimester. The changes vary from woman to woman. Your weight will continue to increase. You will notice your lower abdomen bulging out. You may begin to get stretch marks on your hips, abdomen, and breasts. You may develop headaches that can be relieved by medicines. The medicines should be approved by your health care provider. You may urinate more often because the fetus is pressing on your bladder. You may develop or continue to have heartburn as a result of your pregnancy. You may develop constipation because certain hormones are causing the muscles that push waste through your intestines to slow down. You may develop hemorrhoids or swollen, bulging veins (varicose veins). You may have back pain. This is caused by: Weight gain. Pregnancy hormones that are relaxing the joints in your pelvis. A shift in weight and the muscles that support your balance. Your breasts will continue to grow and they will continue to become tender. Your gums may bleed and may be sensitive to brushing and flossing. Dark spots or blotches (chloasma, mask of pregnancy) may develop on your face. This will likely fade after the baby is born. A dark line from your belly button to  the pubic area (linea nigra) may appear. This will likely fade after the baby is born. You may have changes in your hair. These can include thickening of your hair, rapid growth, and changes in texture. Some women also have hair loss during or after pregnancy, or hair that feels dry or thin. Your hair will most likely return to normal after your baby is born.  What to expect at prenatal visits During a routine prenatal visit: You will be weighed to make sure you and the fetus are growing normally. Your blood pressure will be taken. Your abdomen will be measured to track your baby's growth. The fetal heartbeat will be listened to. Any test results from the previous visit will be discussed.  Your health care provider may ask you: How you are feeling. If you are feeling the baby move. If you have had any abnormal symptoms, such as leaking fluid, bleeding, severe headaches, or abdominal cramping. If you are using any tobacco products, including cigarettes, chewing tobacco, and electronic cigarettes. If you have any questions.  Other tests that may be performed during   your second trimester include: Blood tests that check for: Low iron levels (anemia). High blood sugar that affects pregnant women (gestational diabetes) between 41 and 28 weeks. Rh antibodies. This is to check for a protein on red blood cells (Rh factor). Urine tests to check for infections, diabetes, or protein in the urine. An ultrasound to confirm the proper growth and development of the baby. An amniocentesis to check for possible genetic problems. Fetal screens for spina bifida and Down syndrome. HIV (human immunodeficiency virus) testing. Routine prenatal testing includes screening for HIV, unless you choose not to have this test.  Follow these instructions at home: Medicines Follow your health care provider's instructions regarding medicine use. Specific medicines may be either safe or unsafe to take during  pregnancy. Take a prenatal vitamin that contains at least 600 micrograms (mcg) of folic acid. If you develop constipation, try taking a stool softener if your health care provider approves. Eating and drinking Eat a balanced diet that includes fresh fruits and vegetables, whole grains, good sources of protein such as meat, eggs, or tofu, and low-fat dairy. Your health care provider will help you determine the amount of weight gain that is right for you. Avoid raw meat and uncooked cheese. These carry germs that can cause birth defects in the baby. If you have low calcium intake from food, talk to your health care provider about whether you should take a daily calcium supplement. Limit foods that are high in fat and processed sugars, such as fried and sweet foods. To prevent constipation: Drink enough fluid to keep your urine clear or pale yellow. Eat foods that are high in fiber, such as fresh fruits and vegetables, whole grains, and beans. Activity Exercise only as directed by your health care provider. Most women can continue their usual exercise routine during pregnancy. Try to exercise for 30 minutes at least 5 days a week. Stop exercising if you experience uterine contractions. Avoid heavy lifting, wear low heel shoes, and practice good posture. A sexual relationship may be continued unless your health care provider directs you otherwise. Relieving pain and discomfort Wear a good support bra to prevent discomfort from breast tenderness. Take warm sitz baths to soothe any pain or discomfort caused by hemorrhoids. Use hemorrhoid cream if your health care provider approves. Rest with your legs elevated if you have leg cramps or low back pain. If you develop varicose veins, wear support hose. Elevate your feet for 15 minutes, 3-4 times a day. Limit salt in your diet. Prenatal Care Write down your questions. Take them to your prenatal visits. Keep all your prenatal visits as told by your health  care provider. This is important. Safety Wear your seat belt at all times when driving. Make a list of emergency phone numbers, including numbers for family, friends, the hospital, and police and fire departments. General instructions Ask your health care provider for a referral to a local prenatal education class. Begin classes no later than the beginning of month 6 of your pregnancy. Ask for help if you have counseling or nutritional needs during pregnancy. Your health care provider can offer advice or refer you to specialists for help with various needs. Do not use hot tubs, steam rooms, or saunas. Do not douche or use tampons or scented sanitary pads. Do not cross your legs for long periods of time. Avoid cat litter boxes and soil used by cats. These carry germs that can cause birth defects in the baby and possibly loss of the  fetus by miscarriage or stillbirth. Avoid all smoking, herbs, alcohol, and unprescribed drugs. Chemicals in these products can affect the formation and growth of the baby. Do not use any products that contain nicotine or tobacco, such as cigarettes and e-cigarettes. If you need help quitting, ask your health care provider. Visit your dentist if you have not gone yet during your pregnancy. Use a soft toothbrush to brush your teeth and be gentle when you floss. Contact a health care provider if: You have dizziness. You have mild pelvic cramps, pelvic pressure, or nagging pain in the abdominal area. You have persistent nausea, vomiting, or diarrhea. You have a bad smelling vaginal discharge. You have pain when you urinate. Get help right away if: You have a fever. You are leaking fluid from your vagina. You have spotting or bleeding from your vagina. You have severe abdominal cramping or pain. You have rapid weight gain or weight loss. You have shortness of breath with chest pain. You notice sudden or extreme swelling of your face, hands, ankles, feet, or legs. You  have not felt your baby move in over an hour. You have severe headaches that do not go away when you take medicine. You have vision changes. Summary The second trimester is from week 14 through week 27 (months 4 through 6). It is also a time when the fetus is growing rapidly. Your body goes through many changes during pregnancy. The changes vary from woman to woman. Avoid all smoking, herbs, alcohol, and unprescribed drugs. These chemicals affect the formation and growth your baby. Do not use any tobacco products, such as cigarettes, chewing tobacco, and e-cigarettes. If you need help quitting, ask your health care provider. Contact your health care provider if you have any questions. Keep all prenatal visits as told by your health care provider. This is important. This information is not intended to replace advice given to you by your health care provider. Make sure you discuss any questions you have with your health care provider. Document Released: 10/14/2001 Document Revised: 03/27/2016 Document Reviewed: 12/21/2012 Elsevier Interactive Patient Education  2017 [employer].

## 2022-09-03 LAB — QUANTIFERON-TB GOLD PLUS
QuantiFERON Mitogen Value: 9.85 IU/mL
QuantiFERON Nil Value: 0 IU/mL
QuantiFERON TB1 Ag Value: 0 IU/mL
QuantiFERON TB2 Ag Value: 0 IU/mL
QuantiFERON-TB Gold Plus: NEGATIVE

## 2022-09-03 LAB — INTEGRATED 2
AFP MoM: 1.92
Alpha-Fetoprotein: 39.4 ng/mL
Crown Rump Length: 59.4 mm
DIA MoM: 0.83
DIA Value: 93.8 pg/mL
Estriol, Unconjugated: 0.77 ng/mL
Gest. Age on Collection Date: 12.3 weeks
Gestational Age: 16.3 weeks
Maternal Age at EDD: 28 yr
Nuchal Translucency (NT): 1.4 mm
Nuchal Translucency MoM: 1.06
Number of Fetuses: 1
PAPP-A MoM: 0.25
PAPP-A Value: 204.6 ng/mL
Test Results:: NEGATIVE
Weight: 261 [lb_av]
hCG MoM: 1.37
hCG Value: 30.9 IU/mL
uE3 MoM: 0.97

## 2022-09-22 ENCOUNTER — Encounter: Payer: No Typology Code available for payment source | Admitting: Women's Health

## 2022-09-22 ENCOUNTER — Other Ambulatory Visit: Payer: No Typology Code available for payment source

## 2022-09-30 ENCOUNTER — Encounter: Payer: Self-pay | Admitting: Women's Health

## 2022-09-30 ENCOUNTER — Ambulatory Visit (INDEPENDENT_AMBULATORY_CARE_PROVIDER_SITE_OTHER): Payer: No Typology Code available for payment source

## 2022-09-30 ENCOUNTER — Ambulatory Visit (INDEPENDENT_AMBULATORY_CARE_PROVIDER_SITE_OTHER): Payer: No Typology Code available for payment source | Admitting: Women's Health

## 2022-09-30 VITALS — BP 113/72 | HR 79 | Wt 267.4 lb

## 2022-09-30 DIAGNOSIS — Z3402 Encounter for supervision of normal first pregnancy, second trimester: Secondary | ICD-10-CM

## 2022-09-30 DIAGNOSIS — Z3A20 20 weeks gestation of pregnancy: Secondary | ICD-10-CM

## 2022-09-30 DIAGNOSIS — Z362 Encounter for other antenatal screening follow-up: Secondary | ICD-10-CM

## 2022-09-30 DIAGNOSIS — D219 Benign neoplasm of connective and other soft tissue, unspecified: Secondary | ICD-10-CM

## 2022-09-30 DIAGNOSIS — Z363 Encounter for antenatal screening for malformations: Secondary | ICD-10-CM

## 2022-09-30 NOTE — Patient Instructions (Addendum)
Monique Fox, thank you for choosing our office today! We appreciate the opportunity to meet your healthcare needs. You may receive a short survey by mail, e-mail, or through [employer]. If you are happy with your care we would appreciate if you could take just a few minutes to complete the survey questions. We read all of your comments and take your feedback very seriously. Thank you again for choosing our office.  Center for [employer] Team at [facility] ([place], [place] [address]) Entrance C, located off of [address] parking  Go to [email or web address] to register for FREE online childbirth classes  Call the office [phone]) or go to [facility] if: You begin to severe cramping Your water breaks.  Sometimes it is a big gush of fluid, sometimes it is just a trickle that keeps getting your panties wet or running down your legs You have vaginal bleeding.  It is normal to have a small amount of spotting if your cervix was checked.   [facility] Pediatricians/Family Doctors [facility]): [address] [place], [place]: [address] [place], [phone]                [facility]): [place], [phone] (call to ask if accepting patients) [facility], [place] Department: [address], [place], [place] Pediatricians/Family Doctors [facility]): [place]. [place], [address], [facility]: [address] [place], [facility]: [place], [place] [facility]): [phone] [facility]: [address] [place]: [address] [place] [facility]: [phone], [phone]  Home Blood Pressure Monitoring for Patients   Your provider has recommended that you check your blood pressure (BP) at least once a week at home. If you do not have a blood pressure cuff at home, one will be provided for you. Contact your provider if you have not received your monitor within 1 week.   Helpful Tips for Accurate Home Blood Pressure Checks  Don't smoke, exercise, or drink caffeine 30 minutes before checking your BP Use the restroom before checking your BP (a full bladder can raise your pressure) Relax in a comfortable upright chair Feet on the ground Left arm resting comfortably on a flat surface at the level of your heart Legs uncrossed Back supported Sit quietly and don't talk Place the cuff on your bare arm Adjust snuggly, so that only two fingertips can fit between your skin and the top of the cuff Check 2 readings separated by at least one minute Keep a log of your BP readings For a visual, please reference this diagram: http://ccnc.care/bpdiagram  Provider Name: [facility]     Phone: [phone]  Zone 1: ALL CLEAR  Continue to monitor your symptoms:  BP reading is less than 140 (top number) or less than 90 (bottom number)  No right upper stomach pain No headaches or seeing spots No feeling nauseated or throwing up No swelling in face and hands  Zone 2: CAUTION Call your doctor's office for any of the following:  BP reading is greater than 140 (top number) or greater than  90 (bottom number)  Stomach pain under your ribs in the middle or right side Headaches or seeing spots Feeling nauseated or throwing up Swelling in face and hands  Zone 3: EMERGENCY  Seek immediate medical care if you have any of the following:  BP reading is greater than160 (top number) or greater than 110 (bottom number) Severe headaches not improving with Tylenol Serious difficulty catching your breath Any worsening symptoms from  Zone 2     Second Trimester of Pregnancy The second trimester is from week 14 through week 27 (months 4 through 6). The second trimester is often a time when you feel your best. Your body has adjusted to being pregnant, and you begin to feel better physically. Usually, morning sickness has lessened or quit completely, you may have more energy, and you may have an increase in appetite. The second trimester is also a time when the fetus is growing rapidly. At the end of the sixth month, the fetus is about 9 inches long and weighs about 1 pounds. You will likely begin to feel the baby move (quickening) between 16 and 20 weeks of pregnancy. Body changes during your second trimester Your body continues to go through many changes during your second trimester. The changes vary from woman to woman. Your weight will continue to increase. You will notice your lower abdomen bulging out. You may begin to get stretch marks on your hips, abdomen, and breasts. You may develop headaches that can be relieved by medicines. The medicines should be approved by your health care provider. You may urinate more often because the fetus is pressing on your bladder. You may develop or continue to have heartburn as a result of your pregnancy. You may develop constipation because certain hormones are causing the muscles that push waste through your intestines to slow down. You may develop hemorrhoids or swollen, bulging veins (varicose veins). You may have back pain. This is caused by: Weight gain. Pregnancy hormones that are relaxing the joints in your pelvis. A shift in weight and the muscles that support your balance. Your breasts will continue to grow and they will continue to become tender. Your gums may bleed and may be sensitive to brushing and flossing. Dark spots or blotches (chloasma, mask of pregnancy) may develop on your face. This will likely fade after the baby is born. A dark line from your belly button to  the pubic area (linea nigra) may appear. This will likely fade after the baby is born. You may have changes in your hair. These can include thickening of your hair, rapid growth, and changes in texture. Some women also have hair loss during or after pregnancy, or hair that feels dry or thin. Your hair will most likely return to normal after your baby is born.  What to expect at prenatal visits During a routine prenatal visit: You will be weighed to make sure you and the fetus are growing normally. Your blood pressure will be taken. Your abdomen will be measured to track your baby's growth. The fetal heartbeat will be listened to. Any test results from the previous visit will be discussed.  Your health care provider may ask you: How you are feeling. If you are feeling the baby move. If you have had any abnormal symptoms, such as leaking fluid, bleeding, severe headaches, or abdominal cramping. If you are using any tobacco products, including cigarettes, chewing tobacco, and electronic cigarettes. If you have any questions.  Other tests that may be performed during   your second trimester include: Blood tests that check for: Low iron levels (anemia). High blood sugar that affects pregnant women (gestational diabetes) between 41 and 28 weeks. Rh antibodies. This is to check for a protein on red blood cells (Rh factor). Urine tests to check for infections, diabetes, or protein in the urine. An ultrasound to confirm the proper growth and development of the baby. An amniocentesis to check for possible genetic problems. Fetal screens for spina bifida and Down syndrome. HIV (human immunodeficiency virus) testing. Routine prenatal testing includes screening for HIV, unless you choose not to have this test.  Follow these instructions at home: Medicines Follow your health care provider's instructions regarding medicine use. Specific medicines may be either safe or unsafe to take during  pregnancy. Take a prenatal vitamin that contains at least 600 micrograms (mcg) of folic acid. If you develop constipation, try taking a stool softener if your health care provider approves. Eating and drinking Eat a balanced diet that includes fresh fruits and vegetables, whole grains, good sources of protein such as meat, eggs, or tofu, and low-fat dairy. Your health care provider will help you determine the amount of weight gain that is right for you. Avoid raw meat and uncooked cheese. These carry germs that can cause birth defects in the baby. If you have low calcium intake from food, talk to your health care provider about whether you should take a daily calcium supplement. Limit foods that are high in fat and processed sugars, such as fried and sweet foods. To prevent constipation: Drink enough fluid to keep your urine clear or pale yellow. Eat foods that are high in fiber, such as fresh fruits and vegetables, whole grains, and beans. Activity Exercise only as directed by your health care provider. Most women can continue their usual exercise routine during pregnancy. Try to exercise for 30 minutes at least 5 days a week. Stop exercising if you experience uterine contractions. Avoid heavy lifting, wear low heel shoes, and practice good posture. A sexual relationship may be continued unless your health care provider directs you otherwise. Relieving pain and discomfort Wear a good support bra to prevent discomfort from breast tenderness. Take warm sitz baths to soothe any pain or discomfort caused by hemorrhoids. Use hemorrhoid cream if your health care provider approves. Rest with your legs elevated if you have leg cramps or low back pain. If you develop varicose veins, wear support hose. Elevate your feet for 15 minutes, 3-4 times a day. Limit salt in your diet. Prenatal Care Write down your questions. Take them to your prenatal visits. Keep all your prenatal visits as told by your health  care provider. This is important. Safety Wear your seat belt at all times when driving. Make a list of emergency phone numbers, including numbers for family, friends, the hospital, and police and fire departments. General instructions Ask your health care provider for a referral to a local prenatal education class. Begin classes no later than the beginning of month 6 of your pregnancy. Ask for help if you have counseling or nutritional needs during pregnancy. Your health care provider can offer advice or refer you to specialists for help with various needs. Do not use hot tubs, steam rooms, or saunas. Do not douche or use tampons or scented sanitary pads. Do not cross your legs for long periods of time. Avoid cat litter boxes and soil used by cats. These carry germs that can cause birth defects in the baby and possibly loss of the  fetus by miscarriage or stillbirth. Avoid all smoking, herbs, alcohol, and unprescribed drugs. Chemicals in these products can affect the formation and growth of the baby. Do not use any products that contain nicotine or tobacco, such as cigarettes and e-cigarettes. If you need help quitting, ask your health care provider. Visit your dentist if you have not gone yet during your pregnancy. Use a soft toothbrush to brush your teeth and be gentle when you floss. Contact a health care provider if: You have dizziness. You have mild pelvic cramps, pelvic pressure, or nagging pain in the abdominal area. You have persistent nausea, vomiting, or diarrhea. You have a bad smelling vaginal discharge. You have pain when you urinate. Get help right away if: You have a fever. You are leaking fluid from your vagina. You have spotting or bleeding from your vagina. You have severe abdominal cramping or pain. You have rapid weight gain or weight loss. You have shortness of breath with chest pain. You notice sudden or extreme swelling of your face, hands, ankles, feet, or legs. You  have not felt your baby move in over an hour. You have severe headaches that do not go away when you take medicine. You have vision changes. Summary The second trimester is from week 14 through week 27 (months 4 through 6). It is also a time when the fetus is growing rapidly. Your body goes through many changes during pregnancy. The changes vary from woman to woman. Avoid all smoking, herbs, alcohol, and unprescribed drugs. These chemicals affect the formation and growth your baby. Do not use any tobacco products, such as cigarettes, chewing tobacco, and e-cigarettes. If you need help quitting, ask your health care provider. Contact your health care provider if you have any questions. Keep all prenatal visits as told by your health care provider. This is important. This information is not intended to replace advice given to you by your health care provider. Make sure you discuss any questions you have with your health care provider. Document Released: 10/14/2001 Document Revised: 03/27/2016 Document Reviewed: 12/21/2012 Elsevier Interactive Patient Education  2017 [place] [facility]ACT[facility] PH[facility]ICIANS  [facility] [address] [place], [place] [address] Phone - [phone]   Fax - [place] [address] [place], [place]  [address] Phone - [phone]   Fax - [phone]  [place] [address], [place] [address] [place], [place]  [address] Phone - [phone]   Fax - [phone]  [facility] [address] [place], [place]  [address] Phone - [phone]   Fax - [phone]  [place] [address] [place], [place]  [address] Phone - [phone]   Fax - [place] [address] [place], [place]  [address] Phone - [phone]   Fax - [place] [facility] [place] [address], [place], [place]  [address] Phone - [phone]   Fax - [phone]  [place] [address] [place], [place], [place]  [address] Phone - [phone]   Fax - [place] [address] [place], [place]  [address] Phone - [phone]   Fax - [phone] [facility] [place]. [address] [place],   [address] Phone - [phone]   Fax - [phone]  [place] [address] [place], [place]  [place] Phone - [phone]   Fax - [phone]  [facility] [place], [place], [place]  [address] Phone - [phone]   Fax - [place] [address], [place], [place]  [address] Phone - [phone]   Fax - [phone]  [facility] [address] [place], [place]  [address] Phone - [place], [place]  [address] Phone - [phone]   Fax - [place] [address], [place], [place]  [address] Phone - [phone]   Fax - [phone]  [place] [address] [place], [place]  [address] Phone - [phone]   Fax - [place]. [place], [place]  [address] Phone - [phone]   Fax - [place], [place], [place]  [address] Phone - [phone]   Fax - [place] [address], [place], [place]  [address] Phone - [phone]   Fax - [phone]  [place] [address] [place], [place]  [address] Phone - [phone]   Fax - [place], [place], [place]  [address] Phone - [phone]   Fax - [phone]  [place] [address] [place], [place]  [address] Phone - [phone]   Fax - [phone] [place] [address] [place], [place]  [address] Phone - [phone]   Fax - [place] [address] [place], [place]  [address] Phone - [phone]   Fax - [place] [address], [place], [place]  [address] Phone - [phone]   Fax - [phone]

## 2022-09-30 NOTE — Progress Notes (Signed)
LOW-RISK PREGNANCY VISIT Patient name: Monique Fox MRN [id number]  Date of birth: 26-Dec-1995 Chief Complaint:   Routine Prenatal Visit  History of Present Illness:   Monique Fox is a 26 y.o. G32P0000 female at 73w2dwith an Estimated Date of Delivery: 02/15/23 being seen today for ongoing management of a low-risk pregnancy.   Today she reports round ligament pain. Contractions: Not present. Vag. Bleeding: None.  Movement: Present. denies leaking of fluid.     08/04/2022    9:27 AM 01/23/2022   10:02 AM 02/24/2017    9:08 AM  Depression screen PHQ 2/9  Decreased Interest 0 0 0  Down, Depressed, Hopeless 0 0 0  PHQ - 2 Score 0 0 0  Altered sleeping 0 1   Tired, decreased energy 1 1   Change in appetite 0 1   Feeling bad or failure about yourself  0 1   Trouble concentrating 0 0   Moving slowly or fidgety/restless 0 0   Suicidal thoughts 0 0   PHQ-9 Score 1 4         08/04/2022    9:28 AM 01/23/2022   10:03 AM  GAD 7 : Generalized Anxiety Score  Nervous, Anxious, on Edge 0 2  Control/stop worrying 0 2  Worry too much - different things 0 2  Trouble relaxing 0 2  Restless 0 1  Easily annoyed or irritable 0 1  Afraid - awful might happen 0 0  Total GAD 7 Score 0 10      Review of Systems:   Pertinent items are noted in HPI Denies abnormal vaginal discharge w/ itching/odor/irritation, headaches, visual changes, shortness of breath, chest pain, abdominal pain, severe nausea/vomiting, or problems with urination or bowel movements unless otherwise stated above. Pertinent History Reviewed:  Reviewed past medical,surgical, social, obstetrical and family history.  Reviewed problem list, medications and allergies. Physical Assessment:   Vitals:   09/30/22 1150  BP: 113/72  Pulse: 79  Weight: 267 lb 6 oz (121.3 kg)  Body mass index is 47.36 kg/m.        Physical Examination:   General appearance: Well appearing, and in no distress  Mental status: Alert, oriented to  person, place, and time  Skin: Warm & dry  Cardiovascular: Normal heart rate noted  Respiratory: Normal respiratory effort, no distress  Abdomen: Soft, gravid, nontender  Pelvic: Cervical exam deferred         Extremities: Edema: None  Fetal Status: Fetal Heart Rate (bpm): 146 u/s   Movement: Present  [place]234+1wks,cephalic,cx 2.9 cm,anterior placenta gr 0,normal ovaries,SVP of fluid 4 cm,FHR 146 bpm,anterior subserosal fibroid 1.6 x .9 x 1.7 cm,EFW 369 g 65%,limited view of spine because of fetal position,please have pt come back for additional images,no obvious abnormalities   Chaperone: N/A   No results found for this or any previous visit (from the past 24 hour(s)).  Assessment & Plan:  1) Low-risk pregnancy G1P0000 at 237w2dith an Estimated Date of Delivery: 02/15/23   2) Small uterine fibroid, 1.6cm, discussed   Meds: No orders of the defined types were placed in this encounter.  Labs/procedures today: U/S  Plan:  Continue routine obstetrical care  Next visit: prefers will be in person for f/u u/s     Reviewed: Preterm labor symptoms and general obstetric precautions including but not limited to vaginal bleeding, contractions, leaking of fluid and fetal movement were reviewed in detail with the patient.  All questions were answered.  Follow-up: Return in about 4 weeks (around 10/28/2022) for LROB, US:OB F/U spine, CNM, in person (still haven't gotten pap, requested 10/2 & 10/30).  No future appointments.  Orders Placed This Encounter  Procedures   US OB Follow Up   [place], [facility] 09/30/2022 12:27 PM

## 2022-09-30 NOTE — Progress Notes (Signed)
[place] 09+4 wks,cephalic,cx 2.9 cm,anterior placenta gr 0,normal ovaries,SVP of fluid 4 cm,FHR 146 bpm,anterior subserosal fibroid 1.6 x .9 x 1.7 cm,EFW 369 g 65%,limited view of spine because of fetal position,please have pt come back for additional images,no obvious abnormalities

## 2022-10-31 ENCOUNTER — Encounter: Payer: Self-pay | Admitting: Obstetrics & Gynecology

## 2022-10-31 ENCOUNTER — Ambulatory Visit (INDEPENDENT_AMBULATORY_CARE_PROVIDER_SITE_OTHER): Payer: No Typology Code available for payment source

## 2022-10-31 ENCOUNTER — Ambulatory Visit (INDEPENDENT_AMBULATORY_CARE_PROVIDER_SITE_OTHER): Payer: No Typology Code available for payment source | Admitting: Obstetrics & Gynecology

## 2022-10-31 VITALS — BP 137/92 | HR 83 | Wt 262.0 lb

## 2022-10-31 DIAGNOSIS — Z3402 Encounter for supervision of normal first pregnancy, second trimester: Secondary | ICD-10-CM | POA: Diagnosis not present

## 2022-10-31 DIAGNOSIS — Z362 Encounter for other antenatal screening follow-up: Secondary | ICD-10-CM | POA: Diagnosis not present

## 2022-10-31 DIAGNOSIS — Z3A24 24 weeks gestation of pregnancy: Secondary | ICD-10-CM

## 2022-10-31 LAB — POCT URINALYSIS DIPSTICK OB
Blood, UA: NEGATIVE
Glucose, UA: NEGATIVE
Leukocytes, UA: NEGATIVE
Nitrite, UA: NEGATIVE
POC,PROTEIN,UA: NEGATIVE

## 2022-10-31 NOTE — Progress Notes (Signed)
[place] 00+3 wks,cephalic,anterior placenta gr 0,svp of fluid 5.6 cm,FHR 152 bpm,cx 2.8 cm,EFW 820 g 75%,anatomy of the spine complete,limited view because of pt body habitus

## 2022-10-31 NOTE — Progress Notes (Signed)
LOW-RISK PREGNANCY VISIT Patient name: Monique Fox MRN [id number]  Date of birth: 06/14/1996 Chief Complaint:   Routine Prenatal Visit  History of Present Illness:   Monique Fox is a 26 y.o. G56P0000 female at 38w5dwith an Estimated Date of Delivery: 02/15/23 being seen today for ongoing management of a low-risk pregnancy.     08/04/2022    9:27 AM 01/23/2022   10:02 AM 02/24/2017    9:08 AM  Depression screen PHQ 2/9  Decreased Interest 0 0 0  Down, Depressed, Hopeless 0 0 0  PHQ - 2 Score 0 0 0  Altered sleeping 0 1   Tired, decreased energy 1 1   Change in appetite 0 1   Feeling bad or failure about yourself  0 1   Trouble concentrating 0 0   Moving slowly or fidgety/restless 0 0   Suicidal thoughts 0 0   PHQ-9 Score 1 4     Today she reports no complaints. Contractions: Not present. Vag. Bleeding: None.  Movement: Present. denies leaking of fluid. Review of Systems:   Pertinent items are noted in HPI Denies abnormal vaginal discharge w/ itching/odor/irritation, headaches, visual changes, shortness of breath, chest pain, abdominal pain, severe nausea/vomiting, or problems with urination or bowel movements unless otherwise stated above. Pertinent History Reviewed:  Reviewed past medical,surgical, social, obstetrical and family history.  Reviewed problem list, medications and allergies. Physical Assessment:   Vitals:   10/31/22 1315 10/31/22 1321  BP: (!) 135/96 (!) 137/92  Pulse: 89 83  Weight: 262 lb (118.8 kg)   Body mass index is 46.41 kg/m.        Physical Examination:   General appearance: Well appearing, and in no distress  Mental status: Alert, oriented to person, place, and time  Skin: Warm & dry  Cardiovascular: Normal heart rate noted  Respiratory: Normal respiratory effort, no distress  Abdomen: Soft, gravid, nontender  Pelvic: Cervical exam deferred         Extremities: Edema: None  Fetal Status:     Movement: Present    Chaperone: n/a     Results for orders placed or performed in visit on 10/31/22 (from the past 24 hour(s))  POC Urinalysis Dipstick OB   Collection Time: 10/31/22  1:25 PM  Result Value Ref Range   Color, UA     Clarity, UA     Glucose, UA Negative Negative   Bilirubin, UA     Ketones, UA small    Spec Grav, UA     Blood, UA negative    pH, UA     POC,PROTEIN,UA Negative Negative, Trace, Small (1+), Moderate (2+), Large (3+), 4+   Urobilinogen, UA     Nitrite, UA negative    Leukocytes, UA Negative Negative   Appearance     Odor      Assessment & Plan:  1) Low-risk pregnancy G1P0000 at 28w5dith an Estimated Date of Delivery: 02/15/23   2) BP elevated, will have log twice a day for next week to track, double ASA to 162 mg daily   Meds: No orders of the defined types were placed in this encounter.  Labs/procedures today: sonogram  Plan:  Continue routine obstetrical care  Next visit: prefers in person    Reviewed: Term labor symptoms and general obstetric precautions including but not limited to vaginal bleeding, contractions, leaking of fluid and fetal movement were reviewed in detail with the patient.  All questions were answered. Has home bp cuff. Rx faxed  to . Check bp weekly, let us know if >140/90.   Follow-up: Return in about 1 week (around 11/07/2022) for [place] visit, with Dr Elonda Husky.  Orders Placed This Encounter  Procedures   POC Urinalysis Dipstick OB    Florian Buff, MD 10/31/2022 1:54 PM

## 2022-11-03 NOTE — L&D Delivery Note (Signed)
OB/GYN Faculty Practice Delivery Note  Monique Fox is a 27 y.o. G1P0101 s/p VD at [redacted]w[redacted]d. She was admitted for PPROM.   ROM: 26h 65m with clear fluid GBS Status: unknown-PCN   Maximum Maternal Temperature: 98.60F  Labor Progress: Initial SVE: 3/60/-2. She then progressed to complete.   Delivery Date/Time: 01/21/23 at 0924 Delivery: Called to room and patient was complete and pushing. Head delivered direct OA. No nuchal cord present. Shoulder and body delivered in usual fashion. Infant with spontaneous cry, placed on mother's abdomen, dried and stimulated. Cord clamped x 2 after 1-minute delay, and cut by FOB. Cord blood drawn. Placenta delivered spontaneously with gentle cord traction. Fundus firm with massage and Pitocin. Labia, perineum, vagina, and cervix inspected with R vaginal wall, repaired in usual fashion and L periurethral tear that was hemostatic.  Baby Weight: pending  Placenta: 3 vessel, intact. Sent to L&D Complications: None Lacerations: R vaginal wall, repaired in usual fashion and L periurethral tear that was hemostatic.  Baby Weight: pending EBL: 153 mL Analgesia: Epidural   Infant:  APGAR (1 MIN):  7 APGAR (5 MINS):  [place], DO OB Family Medicine Fellow, [facility] for [employer] [employer] 01/21/2023, 10:00 AM

## 2022-11-07 ENCOUNTER — Telehealth (INDEPENDENT_AMBULATORY_CARE_PROVIDER_SITE_OTHER): Payer: 59 | Admitting: Obstetrics & Gynecology

## 2022-11-07 ENCOUNTER — Encounter: Payer: Self-pay | Admitting: Obstetrics & Gynecology

## 2022-11-07 VITALS — BP 118/74 | HR 94 | Ht 62.5 in

## 2022-11-07 DIAGNOSIS — Z3402 Encounter for supervision of normal first pregnancy, second trimester: Secondary | ICD-10-CM

## 2022-11-07 DIAGNOSIS — Z3A25 25 weeks gestation of pregnancy: Secondary | ICD-10-CM

## 2022-11-07 NOTE — Progress Notes (Signed)
I connected with Monique Fox 11/07/22 at 12:10 PM EST by: MyChart video and verified that I am speaking with the correct person using two identifiers.  Patient is located at home and provider is located at office Total time 10 minutes.     I discussed the limitations, risks, security and privacy concerns of performing an evaluation and management service by MyChart video and the availability of in person appointments. I also discussed with the patient that there may be a patient responsible charge related to this service. By engaging in this virtual visit, you consent to the provision of healthcare.  Additionally, you authorize for your insurance to be billed for the services provided during this visit.  The patient expressed understanding and agreed to proceed.  The following staff members participated in the virtual visit:  Levy Pupa LPN    PRENATAL VISIT NOTE  Subjective:  Monique Fox is a 27 y.o. G1P0000 at [redacted]w[redacted]d for virtual visit for ongoing prenatal care.  She is currently monitored for the following issues for this low-risk pregnancy and has Anxiety; Supervision of normal first pregnancy; and Fibroid on their problem list.  Patient reports no complaints.  Contractions: Not present. Vag. Bleeding: None.  Movement: Present. Denies leaking of fluid.   The following portions of the patient's history were reviewed and updated as appropriate: allergies, current medications, past family history, past medical history, past social history, past surgical history and problem list.   Objective:   Vitals:   11/07/22 1206 11/07/22 1207 11/07/22 1210  BP:  (!) 125/90 118/74  Pulse:  84 94  Height: 5' 2.5" (1.588 m)     Self-Obtained  Fetal Status:     Movement: Present     Assessment and Plan:  Pregnancy: G1P0000 at 217w5d. Encounter for supervision of normal first pregnancy in second trimester  2.  Borderline BP:  BP log shows epressures trending up but not yet at medication  levels, continue to monitor, follow up 2 weeks for PN2, OBV  If BP rises in meantime parameters given for contacting usKoreaPreterm labor symptoms and general obstetric precautions including but not limited to vaginal bleeding, contractions, leaking of fluid and fetal movement were reviewed in detail with the patient.  No follow-ups on file.  No future appointments.   Time spent on virtual visit: 10 minutes  LuFlorian BuffMD

## 2022-11-24 ENCOUNTER — Ambulatory Visit (INDEPENDENT_AMBULATORY_CARE_PROVIDER_SITE_OTHER): Payer: 59 | Admitting: Women's Health

## 2022-11-24 ENCOUNTER — Other Ambulatory Visit: Payer: 59

## 2022-11-24 ENCOUNTER — Encounter: Payer: Self-pay | Admitting: Women's Health

## 2022-11-24 ENCOUNTER — Encounter: Payer: 59 | Admitting: Women's Health

## 2022-11-24 VITALS — BP 127/81 | HR 99 | Wt 263.4 lb

## 2022-11-24 DIAGNOSIS — Z3A28 28 weeks gestation of pregnancy: Secondary | ICD-10-CM

## 2022-11-24 DIAGNOSIS — Z131 Encounter for screening for diabetes mellitus: Secondary | ICD-10-CM

## 2022-11-24 DIAGNOSIS — Z3402 Encounter for supervision of normal first pregnancy, second trimester: Secondary | ICD-10-CM

## 2022-11-24 DIAGNOSIS — Z3403 Encounter for supervision of normal first pregnancy, third trimester: Secondary | ICD-10-CM

## 2022-11-24 DIAGNOSIS — Z3A27 27 weeks gestation of pregnancy: Secondary | ICD-10-CM

## 2022-11-24 MED ORDER — ALBUTEROL SULFATE HFA 108 (90 BASE) MCG/ACT IN AERS: 2.0000 | INHALATION_SPRAY | Freq: Four times a day (QID) | RESPIRATORY_TRACT | 2 refills | Status: DC | PRN

## 2022-11-24 NOTE — Patient Instructions (Addendum)
Marg[place], thank you for choosing our office today! We appreciate the opportunity to meet your healthcare needs. You may receive a short survey by mail, e-mail, or through MyChart. If you are happy with your care we would appreciate if you could take just a few minutes to complete the survey questions. We read all of your comments and take your feedback very seriously. Thank you again for choosing our office.  [facility] ([address] [place], [place] [address]) Entrance C, located off of [address] Free 24/7 valet parking   CLASSES: Go to [email or web address] to register for classes (childbirth, breastfeeding, waterbirth, infant CPR, daddy bootcamp, etc.)  Call the office ([phone]) or go to [facility] if: You begin to have strong, frequent contractions Your water breaks.  Sometimes it is a big gush of fluid, sometimes it is just a trickle that keeps getting your panties wet or running down your legs You have vaginal bleeding.  It is normal to have a small amount of spotting if your cervix was checked.  You don't feel your baby moving like normal.  If you don't, get you something to eat and drink and lay down and focus on feeling your baby move.   If your baby is still not moving like normal, you should call the office or go to [facility].  Call the office ([phone]) or go to [facility] for these signs of pre-eclampsia: Severe headache that does not go away with Tylenol Visual changes- seeing spots, double, blurred vision Pain under your right breast or upper abdomen that does not go away with Tums or heartburn medicine Nausea and/or vomiting Severe swelling in your hands, feet, and face   Tdap Vaccine It is recommended that you get the Tdap vaccine during the third trimester of EACH pregnancy to help protect your baby from getting pertussis (whooping cough) 27-36 weeks is the BEST time to do  this so that you can pass the protection on to your baby. During pregnancy is better than after pregnancy, but if you are unable to get it during pregnancy it will be offered at the hospital.  You can get this vaccine with us, at the health department, your family doctor, or some local pharmacies Everyone who will be around your baby should also be up-to-date on their vaccines before the baby comes. Adults (who are not pregnant) only need 1 dose of Tdap during adulthood.   [place] Pediatricians/Family Doctors [facility] ([facility]): [address], [phone]           [facility]: [address], [phone]                [facility] ([facility]): [address], [phone] (call to ask if accepting patients) [facility]: [address], [place], [phone]    [place] Pediatricians/Family Doctors [facility] ([facility]): [address], [phone] [facility]: [address], [phone] [facility]: [address], [phone]  [place] Family [facility]  [facility] ([facility]): [phone] [facility]: [address], [phone]   [place] Family Doctors [facility]: [address], [phone]  [place] [facility]  [facility]: [address], [phone]  Home Blood Pressure Monitoring for Patients   Your provider has recommended that you check your   blood pressure (BP) at least once a week at home. If you do not have a blood pressure cuff at home, one will be provided for you. Contact your provider if you have not received your monitor within 1 week.   Helpful Tips for Accurate Home Blood Pressure Checks  Don't smoke, exercise, or drink caffeine 30 minutes before checking your BP Use the restroom before checking your BP (a full bladder can raise your  pressure) Relax in a comfortable upright chair Feet on the ground Left arm resting comfortably on a flat surface at the level of your heart Legs uncrossed Back supported Sit quietly and don't talk Place the cuff on your bare arm Adjust snuggly, so that only two fingertips can fit between your skin and the top of the cuff Check 2 readings separated by at least one minute Keep a log of your BP readings For a visual, please reference this diagram: http://ccnc.care/bpdiagram  Provider Name: [facility]     Phone: [phone]  Zone 1: ALL CLEAR  Continue to monitor your symptoms:  BP reading is less than 140 (top number) or less than 90 (bottom number)  No right upper stomach pain No headaches or seeing spots No feeling nauseated or throwing up No swelling in face and hands  Zone 2: CAUTION Call your doctor's office for any of the following:  BP reading is greater than 140 (top number) or greater than 90 (bottom number)  Stomach pain under your ribs in the middle or right side Headaches or seeing spots Feeling nauseated or throwing up Swelling in face and hands  Zone 3: EMERGENCY  Seek immediate medical care if you have any of the following:  BP reading is greater than160 (top number) or greater than 110 (bottom number) Severe headaches not improving with Tylenol Serious difficulty catching your breath Any worsening symptoms from Zone 2   Third Trimester of Pregnancy The third trimester is from week 29 through week 42, months 7 through 9. The third trimester is a time when the fetus is growing rapidly. At the end of the ninth month, the fetus is about 20 inches in length and weighs 6-10 pounds.  BODY CHANGES Your body goes through many changes during pregnancy. The changes vary from woman to woman.  Your weight will continue to increase. You can expect to gain 25-35 pounds (11-16 kg) by the end of the pregnancy. You may begin to get stretch marks on your hips, abdomen,  and breasts. You may urinate more often because the fetus is moving lower into your pelvis and pressing on your bladder. You may develop or continue to have heartburn as a result of your pregnancy. You may develop constipation because certain hormones are causing the muscles that push waste through your intestines to slow down. You may develop hemorrhoids or swollen, bulging veins (varicose veins). You may have pelvic pain because of the weight gain and pregnancy hormones relaxing your joints between the bones in your pelvis. Backaches may result from overexertion of the muscles supporting your posture. You may have changes in your hair. These can include thickening of your hair, rapid growth, and changes in texture. Some women also have hair loss during or after pregnancy, or hair that feels dry or thin. Your hair will most likely return to normal after your baby is born. Your breasts will continue to grow and be tender. A yellow discharge may leak from your breasts called colostrum. Your belly button may stick out. You may   feel short of breath because of your expanding uterus. You may notice the fetus "dropping," or moving lower in your abdomen. You may have a bloody mucus discharge. This usually occurs a few days to a week before labor begins. Your cervix becomes thin and soft (effaced) near your due date. WHAT TO EXPECT AT YOUR PRENATAL EXAMS  You will have prenatal exams every 2 weeks until week 36. Then, you will have weekly prenatal exams. During a routine prenatal visit: You will be weighed to make sure you and the fetus are growing normally. Your blood pressure is taken. Your abdomen will be measured to track your baby's growth. The fetal heartbeat will be listened to. Any test results from the previous visit will be discussed. You may have a cervical check near your due date to see if you have effaced. At around 36 weeks, your caregiver will check your cervix. At the same time, your  caregiver will also perform a test on the secretions of the vaginal tissue. This test is to determine if a type of bacteria, Group B streptococcus, is present. Your caregiver will explain this further. Your caregiver may ask you: What your birth plan is. How you are feeling. If you are feeling the baby move. If you have had any abnormal symptoms, such as leaking fluid, bleeding, severe headaches, or abdominal cramping. If you have any questions. Other tests or screenings that may be performed during your third trimester include: Blood tests that check for low iron levels (anemia). Fetal testing to check the health, activity level, and growth of the fetus. Testing is done if you have certain medical conditions or if there are problems during the pregnancy. FALSE LABOR You may feel small, irregular contractions that eventually go away. These are called Braxton Hicks contractions, or false labor. Contractions may last for hours, days, or even weeks before true labor sets in. If contractions come at regular intervals, intensify, or become painful, it is best to be seen by your caregiver.  SIGNS OF LABOR  Menstrual-like cramps. Contractions that are 5 minutes apart or less. Contractions that start on the top of the uterus and spread down to the lower abdomen and back. A sense of increased pelvic pressure or back pain. A watery or bloody mucus discharge that comes from the vagina. If you have any of these signs before the 37th week of pregnancy, call your caregiver right away. You need to go to the hospital to get checked immediately. HOME CARE INSTRUCTIONS  Avoid all smoking, herbs, alcohol, and unprescribed drugs. These chemicals affect the formation and growth of the baby. Follow your caregiver's instructions regarding medicine use. There are medicines that are either safe or unsafe to take during pregnancy. Exercise only as directed by your caregiver. Experiencing uterine cramps is a good sign to  stop exercising. Continue to eat regular, healthy meals. Wear a good support bra for breast tenderness. Do not use hot tubs, steam rooms, or saunas. Wear your seat belt at all times when driving. Avoid raw meat, uncooked cheese, cat litter boxes, and soil used by cats. These carry germs that can cause birth defects in the baby. Take your prenatal vitamins. Try taking a stool softener (if your caregiver approves) if you develop constipation. Eat more high-fiber foods, such as fresh vegetables or fruit and whole grains. Drink plenty of fluids to keep your urine clear or pale yellow. Take warm sitz baths to soothe any pain or discomfort caused by hemorrhoids. Use hemorrhoid cream if   your caregiver approves. If you develop varicose veins, wear support hose. Elevate your feet for 15 minutes, 3-4 times a day. Limit salt in your diet. Avoid heavy lifting, wear low heal shoes, and practice good posture. Rest a lot with your legs elevated if you have leg cramps or low back pain. Visit your dentist if you have not gone during your pregnancy. Use a soft toothbrush to brush your teeth and be gentle when you floss. A sexual relationship may be continued unless your caregiver directs you otherwise. Do not travel far distances unless it is absolutely necessary and only with the approval of your caregiver. Take prenatal classes to understand, practice, and ask questions about the labor and delivery. Make a trial run to the hospital. Pack your hospital bag. Prepare the baby's nursery. Continue to go to all your prenatal visits as directed by your caregiver. SEEK MEDICAL CARE IF: You are unsure if you are in labor or if your water has broken. You have dizziness. You have mild pelvic cramps, pelvic pressure, or nagging pain in your abdominal area. You have persistent nausea, vomiting, or diarrhea. You have a bad smelling vaginal discharge. You have pain with urination. SEEK IMMEDIATE MEDICAL CARE IF:  You  have a fever. You are leaking fluid from your vagina. You have spotting or bleeding from your vagina. You have severe abdominal cramping or pain. You have rapid weight loss or gain. You have shortness of breath with chest pain. You notice sudden or extreme swelling of your face, hands, ankles, feet, or legs. You have not felt your baby move in over an hour. You have severe headaches that do not go away with medicine. You have vision changes. Document Released: 10/14/2001 Document Revised: 10/25/2013 Document Reviewed: 12/21/2012 [facility] Patient Information 2015 [place], [place]. This information is not intended to replace advice given to you by your health care provider. Make sure you discuss any questions you have with your health care provider.  AREA PEDIATRIC/FAMILY PRACTICE PHYSICIANS  [facility] [place] [address] [place], [place] [address] Phone - [phone]   Fax - [place] [address] [place], [place]  [address] Phone - [phone]   Fax - [phone]  [place]. [address] [place] [address] [place], [place]  [address] Phone - [phone]   Fax - [phone]  [facility] [address] [place], [place]  [address] Phone - [phone]   Fax - [phone]  [place] [address] [place], [place]  [address] Phone - [phone]   Fax - [place] [address] [place], [place]  [address] Phone - [phone]   Fax - [place] [facility] [place] [address] [place], [place]  [address] Phone - [phone]   Fax - [phone]  [place] [address] [place], [place], [place]  [address] Phone - [phone]   Fax - [place] [address] [place], [place]  [address] Phone - [phone]   Fax -  [phone] [facility] [place]. [address] [place], [place]  [address] Phone - [phone]   Fax - [phone]  [place] [address] [place], [place]  [address] Phone - [phone]   Fax - [phone]  [facility] [address] [place], [place]  [address]  Phone - [phone]   Fax - [place] [address], [place], [place]  [address] Phone - [phone]   Fax - [phone]  [facility] [address] [place], [place]  [address] Phone - [place], [place]  [address] Phone - [phone]   Fax - [place] [address] [place], [place]  [address] Phone - [phone]   Fax - [phone]  [place] [address] [place], [place]  [address] Phone - [phone]   Fax - [place]. [place], [place]  [address] Phone - [phone]   Fax - [place], [place], [place]  [address] Phone - [phone]   Fax - [place] [address] [place], [place]  [address] Phone - [phone]   Fax - [phone]  [place] [address], [place], [place]  [address] Phone - [phone]   Fax - [place], [place], [place]  [address] Phone - [phone]   Fax - [phone]  [place] [address] [place], [place]  [address] Phone - [phone]   Fax - [phone] [place] [address] [place], [place]  [address] Phone - [phone]   Fax - [place] [address] [place], [place]  [address] Phone - [phone]   Fax - [place] [address], [place],   [address] Phone - [phone]   Fax - [phone]

## 2022-11-24 NOTE — Progress Notes (Signed)
LOW-RISK PREGNANCY VISIT Patient name: Monique Fox MRN [id number]  Date of birth: 1995/11/09 Chief Complaint:   Routine Prenatal Visit  History of Present Illness:   Monique Fox is a 27 y.o. G51P0000 female at 78w1dwith an Estimated Date of Delivery: 02/15/23 being seen today for ongoing management of a low-risk pregnancy.   Today she reports no complaints. Requests refill on albuterol inhaler. Still do not have pap records, states she called PCP (Delphina Cahill after last visit and they said they would fax over, was negative. Contractions: Not present. Vag. Bleeding: None.  Movement: Present. denies leaking of fluid.     08/04/2022    9:27 AM 01/23/2022   10:02 AM 02/24/2017    9:08 AM  Depression screen PHQ 2/9  Decreased Interest 0 0 0  Down, Depressed, Hopeless 0 0 0  PHQ - 2 Score 0 0 0  Altered sleeping 0 1   Tired, decreased energy 1 1   Change in appetite 0 1   Feeling bad or failure about yourself  0 1   Trouble concentrating 0 0   Moving slowly or fidgety/restless 0 0   Suicidal thoughts 0 0   PHQ-9 Score 1 4         08/04/2022    9:28 AM 01/23/2022   10:03 AM  GAD 7 : Generalized Anxiety Score  Nervous, Anxious, on Edge 0 2  Control/stop worrying 0 2  Worry too much - different things 0 2  Trouble relaxing 0 2  Restless 0 1  Easily annoyed or irritable 0 1  Afraid - awful might happen 0 0  Total GAD 7 Score 0 10      Review of Systems:   Pertinent items are noted in HPI Denies abnormal vaginal discharge w/ itching/odor/irritation, headaches, visual changes, shortness of breath, chest pain, abdominal pain, severe nausea/vomiting, or problems with urination or bowel movements unless otherwise stated above. Pertinent History Reviewed:  Reviewed past medical,surgical, social, obstetrical and family history.  Reviewed problem list, medications and allergies. Physical Assessment:   Vitals:   11/24/22 1104  BP: 127/81  Pulse: 99  Weight: 263 lb 6.4 oz  (119.5 kg)  Body mass index is 47.41 kg/m.        Physical Examination:   General appearance: Well appearing, and in no distress  Mental status: Alert, oriented to person, place, and time  Skin: Warm & dry  Cardiovascular: Normal heart rate noted  Respiratory: Normal respiratory effort, no distress  Abdomen: Soft, gravid, nontender  Pelvic: Cervical exam deferred         Extremities: Edema: None  Fetal Status: Fetal Heart Rate (bpm): 142 Fundal Height: 29 cm Movement: Present    Chaperone: N/A   No results found for this or any previous visit (from the past 24 hour(s)).  Assessment & Plan:  1) Low-risk pregnancy G1P0000 at 251w1dith an Estimated Date of Delivery: 02/15/23   2) Asthma, refilled albuterol inhaler  3) BMI 47> U/S q4wks   2x/wk nst or weekly BPP @ 36wks   Deliver @ 39-40wks    Meds:  Meds ordered this encounter  Medications   albuterol (VENTOLIN HFA) 108 (90 Base) MCG/ACT inhaler    Sig: Inhale 2 puffs into the lungs every 6 (six) hours as needed for wheezing or shortness of breath.    Dispense:  8 g    Refill:  2   Labs/procedures today: PN2 and declines tdap today, plans next visit  Plan:  Continue routine obstetrical care  Next visit: prefers online    Reviewed: Preterm labor symptoms and general obstetric precautions including but not limited to vaginal bleeding, contractions, leaking of fluid and fetal movement were reviewed in detail with the patient.  All questions were answered. Does have home bp cuff. Office bp cuff given: not applicable. Check bp weekly, let us know if consistently >140 and/or >90.  Follow-up: Return in about 2 weeks (around 12/08/2022) for LROB, CNM, MyChart Video; then 4wks EFW u/s & LROB w/ MD/CNM; check for pap records.  No future appointments.  No orders of the defined types were placed in this encounter.  [place], [facility] 11/24/2022 11:40 AM

## 2022-11-25 LAB — CBC
Hematocrit: 36.6 % (ref 34.0–46.6)
Hemoglobin: 12.1 g/dL (ref 11.1–15.9)
MCH: 29 pg (ref 26.6–33.0)
MCHC: 33.1 g/dL (ref 31.5–35.7)
MCV: 88 fL (ref 79–97)
Platelets: 310 10*3/uL (ref 150–450)
RBC: 4.17 x10E6/uL (ref 3.77–5.28)
RDW: 11.6 % — ABNORMAL LOW (ref 11.7–15.4)
WBC: 12.6 10*3/uL — ABNORMAL HIGH (ref 3.4–10.8)

## 2022-11-25 LAB — GLUCOSE TOLERANCE, 2 HOURS W/ 1HR
Glucose, 1 hour: 176 mg/dL (ref 70–179)
Glucose, 2 hour: 138 mg/dL (ref 70–152)
Glucose, Fasting: 87 mg/dL (ref 70–91)

## 2022-11-25 LAB — ANTIBODY SCREEN: Antibody Screen: NEGATIVE

## 2022-11-25 LAB — RPR: RPR Ser Ql: NONREACTIVE

## 2022-11-25 LAB — HIV ANTIBODY (ROUTINE TESTING W REFLEX): HIV Screen 4th Generation wRfx: NONREACTIVE

## 2022-11-27 ENCOUNTER — Encounter: Payer: Self-pay | Admitting: Obstetrics & Gynecology

## 2022-12-08 ENCOUNTER — Encounter: Payer: Self-pay | Admitting: Advanced Practice Midwife

## 2022-12-08 ENCOUNTER — Telehealth (INDEPENDENT_AMBULATORY_CARE_PROVIDER_SITE_OTHER): Payer: 59 | Admitting: Advanced Practice Midwife

## 2022-12-08 VITALS — BP 125/83 | HR 97

## 2022-12-08 DIAGNOSIS — Z3A30 30 weeks gestation of pregnancy: Secondary | ICD-10-CM

## 2022-12-08 DIAGNOSIS — Z3403 Encounter for supervision of normal first pregnancy, third trimester: Secondary | ICD-10-CM

## 2022-12-08 DIAGNOSIS — O99213 Obesity complicating pregnancy, third trimester: Secondary | ICD-10-CM

## 2022-12-08 NOTE — Patient Instructions (Signed)
Monique Fox, I greatly value your feedback.  If you receive a survey following your visit with [place] today, we appreciate you taking the time to fill it out.  Thanks, Nigel Berthold, DNP, CNM  [facility]!!! It is now [place] at [facility] ([place], [place] [address]) Entrance located off of [place] parking   Go to [email or web address] to register for FREE online childbirth classes    Call the office [phone]) or go to [place] if: You begin to have strong, frequent contractions Your water breaks.  Sometimes it is a big gush of fluid, sometimes it is just a trickle that keeps getting your panties wet or running down your legs You have vaginal bleeding.  It is normal to have a small amount of spotting if your cervix was checked.  You don't feel your baby moving like normal.  If you don't, get you something to eat and drink and lay down and focus on feeling your baby move.  You should feel at least 10 movements in 2 hours.  If you don't, you should call the office or go to [place] Blood Pressure Monitoring for Patients   Your provider has recommended that you check your blood pressure (BP) at least once a week at home. If you do not have a blood pressure cuff at home, one will be provided for you. Contact your provider if you have not received your monitor within 1 week.   Helpful Tips for Accurate Home Blood Pressure Checks  Don't smoke, exercise, or drink caffeine 30 minutes before checking your BP Use the restroom before checking your BP (a full bladder can raise your pressure) Relax in a comfortable upright chair Feet on the ground Left arm resting comfortably on a flat surface at the level of your heart Legs uncrossed Back supported Sit quietly and don't talk Place the cuff on your bare arm Adjust snuggly, so that only two fingertips can fit between your skin and the top  of the cuff Check 2 readings separated by at least one minute Keep a log of your BP readings For a visual, please reference this diagram: http://ccnc.care/bpdiagram  Provider Name: [facility]     Phone: [phone]  Zone 1: ALL CLEAR  Continue to monitor your symptoms:  BP reading is less than 140 (top number) or less than 90 (bottom number)  No right upper stomach pain No headaches or seeing spots No feeling nauseated or throwing up No swelling in face and hands  Zone 2: CAUTION Call your doctor's office for any of the following:  BP reading is greater than 140 (top number) or greater than 90 (bottom number)  Stomach pain under your ribs in the middle or right side Headaches or seeing spots Feeling nauseated or throwing up Swelling in face and hands  Zone 3: EMERGENCY  Seek immediate medical care if you have any of the following:  BP reading is greater than160 (top number) or greater than 110 (bottom number) Severe headaches not improving with Tylenol Serious difficulty catching your breath Any worsening symptoms from Zone 2

## 2022-12-08 NOTE — Progress Notes (Signed)
   I connected with Monique Fox 12/08/22 at  3:50 PM EST by: MyChart video and verified that I am speaking with the correct person using two identifiers.  Patient is located at home and [facility]     The purpose of this virtual visit is to provide medical care while limiting exposure to the novel coronavirus. I discussed the limitations, risks, security and privacy concerns of performing an evaluation and management service by MyChart video and the availability of in person appointments. I also discussed with the patient that there may be a patient responsible charge related to this service. By engaging in this virtual visit, you consent to the provision of healthcare.  Additionally, you authorize for your insurance to be billed for the services provided during this visit.  The patient expressed understanding and agreed to proceed.  The following staff members participated in the virtual visit:  [name], RN    TELE-PRENATAL VISIT NOTE  Subjective:  Monique Fox is a 27 y.o. G1P0000 at [redacted]w[redacted]d for phone visit for ongoing prenatal care.  She is currently monitored for the following issues for this low-risk pregnancy and has Anxiety; Supervision of normal first pregnancy; Fibroid; and Morbid obesity with BMI of 50.0-59.9, adult ([place] their problem [place].  Patient reports no complaints.  Contractions: Not present. Vag. Bleeding: None.  Movement: Present. Denies leaking of fluid.   The following portions of the patient's history were reviewed and updated as appropriate: allergies, current medications, past family history, past medical history, past social history, past surgical history and problem list.   Objective:   Vitals:   12/08/22 1548  BP: 125/83  Pulse: 97   Self-Obtained  Fetal Status:     Movement: Present     Assessment and Plan:  Pregnancy: G1P0000 at 353w1d. Encounter for supervision of normal first pregnancy in third trimester   2.  [redacted] weeks gestation of pregnancy  3. Morbid obesity with BMI of 50.0-59.9, adult (HCC) Growth [place] 4 weeks; twice weekly testing at 36 weeks  Preterm labor symptoms and general obstetric precautions including but not limited to vaginal bleeding, contractions, leaking of fluid and fetal movement were reviewed in detail with the patient.  Return for As scheduled.  Future Appointments  Date Time Provider [place]2/21/2024  9:15 AM [facility] - FTOBGYN [place]WH-FTIMG None  12/24/2022 10:10 AM ShMyrtis SerCNM [facility]-FT FTOBGYN  01/19/2023  9:50 AM [facility]-FTOBGYN NURSE [facility] FTOBGYN  01/22/2023 10:00 AM [facility] - FTOBGYN [place]WH-FTIMG None  01/22/2023 10:50 AM CrChristin FudgeCNM [facility]-FT FTOBGYN  01/26/2023 10:10 AM [facility]-FTOBGYN NURSE [facility]-FT FTOBGYN  01/29/2023  8:30 AM [place] [facility] [place]-FTIMG None  01/29/2023  9:30 AM CrChristin FudgeCNM [facility]-FT FTOBGYN  02/02/2023 10:30 AM [facility]-FTOBGYN NURSE [facility]-FT FTOBGYN  02/05/2023  8:30 AM [facility] - FTOBGYN [place]WH-FTIMG None  02/09/2023 10:30 AM [facility]-FTOBGYN NURSE [facility]-FT FTOBGYN  02/12/2023  8:30 AM [facility] - FTOBGYN US[place]WH-FTIMG None     Time spent on virtual visit: 15 minutes  FrChristin FudgeCNM

## 2022-12-23 ENCOUNTER — Other Ambulatory Visit: Payer: Self-pay | Admitting: Advanced Practice Midwife

## 2022-12-23 DIAGNOSIS — O99210 Obesity complicating pregnancy, unspecified trimester: Secondary | ICD-10-CM

## 2022-12-24 ENCOUNTER — Ambulatory Visit (INDEPENDENT_AMBULATORY_CARE_PROVIDER_SITE_OTHER): Payer: 59 | Admitting: Advanced Practice Midwife

## 2022-12-24 ENCOUNTER — Encounter: Payer: Self-pay | Admitting: Advanced Practice Midwife

## 2022-12-24 ENCOUNTER — Ambulatory Visit (INDEPENDENT_AMBULATORY_CARE_PROVIDER_SITE_OTHER): Payer: 59

## 2022-12-24 VITALS — BP 138/85 | HR 105

## 2022-12-24 DIAGNOSIS — Z3A34 34 weeks gestation of pregnancy: Secondary | ICD-10-CM | POA: Diagnosis not present

## 2022-12-24 DIAGNOSIS — Z23 Encounter for immunization: Secondary | ICD-10-CM | POA: Diagnosis not present

## 2022-12-24 DIAGNOSIS — Z3A32 32 weeks gestation of pregnancy: Secondary | ICD-10-CM

## 2022-12-24 DIAGNOSIS — O99210 Obesity complicating pregnancy, unspecified trimester: Secondary | ICD-10-CM

## 2022-12-24 DIAGNOSIS — Z3403 Encounter for supervision of normal first pregnancy, third trimester: Secondary | ICD-10-CM

## 2022-12-24 DIAGNOSIS — O99213 Obesity complicating pregnancy, third trimester: Secondary | ICD-10-CM | POA: Diagnosis not present

## 2022-12-24 DIAGNOSIS — Z6841 Body Mass Index (BMI) 40.0 and over, adult: Secondary | ICD-10-CM

## 2022-12-24 NOTE — Progress Notes (Signed)
   LOW-RISK PREGNANCY VISIT Patient name: Monique Fox MRN [id number]  Date of birth: [birth year] Chief Complaint:   Routine Prenatal Visit  History of Present Illness:   Monique Fox is a 27 y.o. G110P0000 female at 18w3dwith an Estimated Date of Delivery: 02/15/23 being seen today for ongoing management of a low-risk pregnancy.  Today she reports no complaints. Contractions: Not present. Vag. Bleeding: None.  Movement: Present. denies leaking of fluid. Review of Systems:   Pertinent items are noted in HPI Denies abnormal vaginal discharge w/ itching/odor/irritation, headaches, visual changes, shortness of breath, chest pain, abdominal pain, severe nausea/vomiting, or problems with urination or bowel movements unless otherwise stated above. Pertinent History Reviewed:  Reviewed past medical,surgical, social, obstetrical and family history.  Reviewed problem list, medications and allergies. Physical Assessment:   Vitals:   12/24/22 0943  BP: 138/85  Pulse: (!) 105  There is no height or weight on file to calculate BMI.        Physical Examination:   General appearance: Well appearing, and in no distress  Mental status: Alert, oriented to person, place, and time  Skin: Warm & dry  Cardiovascular: Normal heart rate noted  Respiratory: Normal respiratory effort, no distress  Abdomen: Soft, gravid, nontender  Pelvic: Cervical exam deferred         Extremities: Edema: None  Fetal Status: Fetal Heart Rate (bpm): 130 u/s   Movement: Present    Growth: [id number],cephalic,anterior placenta gr 0,AFI 14 cm,FHR 130 bpm,EFW 2263 g 79%   No results found for this or any previous visit (from the past 24 hour(s)).  Assessment & Plan:  1) Low-risk pregnancy G1P0000 at 333w3dith an Estimated Date of Delivery: 02/15/23   2) Obesity, EFW 79%; continue q 4wk growth with 2x/wk testing @ 36wks (already scheduled)  3) Need record of Pap from 8/21, ROI filled out and faxed (hasn't been done  before)   Meds: No orders of the defined types were placed in this encounter.  Labs/procedures today: Tdap  Plan:  Continue routine obstetrical care   Reviewed: Preterm labor symptoms and general obstetric precautions including but not limited to vaginal bleeding, contractions, leaking of fluid and fetal movement were reviewed in detail with the patient.  All questions were answered. Has home bp cuff. Check bp weekly, let usKoreanow if >140/90.   Follow-up: Return in about 2 weeks (around 01/07/2023) for add in 2wk LROB, LROB.  Orders Placed This Encounter  Procedures   Tdap vaccine greater than or equal to 7yo IM   KiMyrtis SerNAbilene [facility]/21/2024 9:55 AM

## 2022-12-24 NOTE — Progress Notes (Signed)
[place] [id number] wks,cephalic,anterior placenta gr 0,AFI 14 cm,FHR 130 bpm,EFW 2263 g 79%

## 2022-12-24 NOTE — Patient Instructions (Signed)
Monique Fox, thank you for choosing our office today! We appreciate the opportunity to meet your healthcare needs. You may receive a short survey by mail, e-mail, or through [employer]. If you are happy with your care we would appreciate if you could take just a few minutes to complete the survey questions. We read all of your comments and take your feedback very seriously. Thank you again for choosing our office.  Center for [employer] Team at [place] at [facility] ([place], [place] [address]) Entrance C, located off of [place] parking   CLASSES: Go to [email or web address] to register for classes (childbirth, breastfeeding, waterbirth, infant CPR, daddy bootcamp, etc.)  Call the office [phone]) or go to [facility] if: You begin to have strong, frequent contractions Your water breaks.  Sometimes it is a big gush of fluid, sometimes it is just a trickle that keeps getting your panties wet or running down your legs You have vaginal bleeding.  It is normal to have a small amount of spotting if your cervix was checked.  You don't feel your baby moving like normal.  If you don't, get you something to eat and drink and lay down and focus on feeling your baby move.   If your baby is still not moving like normal, you should call the office or go to [facility].  Call the office [phone]) or go to [facility] hospital for these signs of pre-eclampsia: Severe headache that does not go away with Tylenol Visual changes- seeing spots, double, blurred vision Pain under your right breast or upper abdomen that does not go away with Tums or heartburn medicine Nausea and/or vomiting Severe swelling in your hands, feet, and face   Tdap Vaccine It is recommended that you get the Tdap vaccine during the third trimester of EACH pregnancy to help protect your baby from getting pertussis (whooping cough) 27-36 weeks is the BEST time to do  this so that you can pass the protection on to your baby. During pregnancy is better than after pregnancy, but if you are unable to get it during pregnancy it will be offered at the hospital.  You can get this vaccine with [place], at the health department, your family doctor, or some local pharmacies Everyone who will be around your baby should also be up-to-date on their vaccines before the baby comes. Adults (who are not pregnant) only need 1 dose of Tdap during adulthood.   [facility] Pediatricians/Family Doctors [facility]): [address] [place], [place] [facility]: [address] [place], [phone]                [facility]): [place], [phone] (call to ask if accepting patients) [facility] Department: [place] [address], [place], [place] Pediatricians/Family Doctors [facility]): [place]. [place], [address], [facility]: [address] [place], [facility] [place]: [place], [facility]): [phone] [facility]: [address] [place]: [address], [place] [facility]: [phone], [phone]  Home Blood Pressure Monitoring for Patients   Your provider has recommended that you check your  blood pressure (BP) at least once a week at home. If you do not have a blood pressure cuff at home, one will be provided for you. Contact your provider if you have not received your monitor within 1 week.   Helpful Tips for Accurate Home Blood Pressure Checks  Don't smoke, exercise, or drink caffeine 30 minutes before checking your BP Use the restroom before checking your BP (a full bladder can raise your  pressure) Relax in a comfortable upright chair Feet on the ground Left arm resting comfortably on a flat surface at the level of your heart Legs uncrossed Back supported Sit quietly and don't talk Place the cuff on your bare arm Adjust snuggly, so that only two fingertips can fit between your skin and the top of the cuff Check 2 readings separated by at least one minute Keep a log of your BP readings For a visual, please reference this diagram: http://ccnc.care/bpdiagram  Provider Name: [facility]     Phone: [phone]  Zone 1: ALL CLEAR  Continue to monitor your symptoms:  BP reading is less than 140 (top number) or less than 90 (bottom number)  No right upper stomach pain No headaches or seeing spots No feeling nauseated or throwing up No swelling in face and hands  Zone 2: CAUTION Call your doctor's office for any of the following:  BP reading is greater than 140 (top number) or greater than 90 (bottom number)  Stomach pain under your ribs in the middle or right side Headaches or seeing spots Feeling nauseated or throwing up Swelling in face and hands  Zone 3: EMERGENCY  Seek immediate medical care if you have any of the following:  BP reading is greater than160 (top number) or greater than 110 (bottom number) Severe headaches not improving with Tylenol Serious difficulty catching your breath Any worsening symptoms from Zone 2   Third Trimester of Pregnancy The third trimester is from week 29 through week 42, months 7 through 9. The third trimester is a time when the fetus is growing rapidly. At the end of the ninth month, the fetus is about 20 inches in length and weighs 6-10 pounds.  BODY CHANGES Your body goes through many changes during pregnancy. The changes vary from woman to woman.  Your weight will continue to increase. You can expect to gain 25-35 pounds (11-16 kg) by the end of the pregnancy. You may begin to get stretch marks on your hips, abdomen,  and breasts. You may urinate more often because the fetus is moving lower into your pelvis and pressing on your bladder. You may develop or continue to have heartburn as a result of your pregnancy. You may develop constipation because certain hormones are causing the muscles that push waste through your intestines to slow down. You may develop hemorrhoids or swollen, bulging veins (varicose veins). You may have pelvic pain because of the weight gain and pregnancy hormones relaxing your joints between the bones in your pelvis. Backaches may result from overexertion of the muscles supporting your posture. You may have changes in your hair. These can include thickening of your hair, rapid growth, and changes in texture. Some women also have hair loss during or after pregnancy, or hair that feels dry or thin. Your hair will most likely return to normal after your baby is born. Your breasts will continue to grow and be tender. A yellow discharge may leak from your breasts called colostrum. Your belly button may stick out. You may   feel short of breath because of your expanding uterus. You may notice the fetus "dropping," or moving lower in your abdomen. You may have a bloody mucus discharge. This usually occurs a few days to a week before labor begins. Your cervix becomes thin and soft (effaced) near your due date. WHAT TO EXPECT AT YOUR PRENATAL EXAMS  You will have prenatal exams every 2 weeks until week 36. Then, you will have weekly prenatal exams. During a routine prenatal visit: You will be weighed to make sure you and the fetus are growing normally. Your blood pressure is taken. Your abdomen will be measured to track your baby's growth. The fetal heartbeat will be listened to. Any test results from the previous visit will be discussed. You may have a cervical check near your due date to see if you have effaced. At around 36 weeks, your caregiver will check your cervix. At the same time, your  caregiver will also perform a test on the secretions of the vaginal tissue. This test is to determine if a type of bacteria, Group B streptococcus, is present. Your caregiver will explain this further. Your caregiver may ask you: What your birth plan is. How you are feeling. If you are feeling the baby move. If you have had any abnormal symptoms, such as leaking fluid, bleeding, severe headaches, or abdominal cramping. If you have any questions. Other tests or screenings that may be performed during your third trimester include: Blood tests that check for low iron levels (anemia). Fetal testing to check the health, activity level, and growth of the fetus. Testing is done if you have certain medical conditions or if there are problems during the pregnancy. FALSE LABOR You may feel small, irregular contractions that eventually go away. These are called Braxton Hicks contractions, or false labor. Contractions may last for hours, days, or even weeks before true labor sets in. If contractions come at regular intervals, intensify, or become painful, it is best to be seen by your caregiver.  SIGNS OF LABOR  Menstrual-like cramps. Contractions that are 5 minutes apart or less. Contractions that start on the top of the uterus and spread down to the lower abdomen and back. A sense of increased pelvic pressure or back pain. A watery or bloody mucus discharge that comes from the vagina. If you have any of these signs before the 37th week of pregnancy, call your caregiver right away. You need to go to the hospital to get checked immediately. HOME CARE INSTRUCTIONS  Avoid all smoking, herbs, alcohol, and unprescribed drugs. These chemicals affect the formation and growth of the baby. Follow your caregiver's instructions regarding medicine use. There are medicines that are either safe or unsafe to take during pregnancy. Exercise only as directed by your caregiver. Experiencing uterine cramps is a good sign to  stop exercising. Continue to eat regular, healthy meals. Wear a good support bra for breast tenderness. Do not use hot tubs, steam rooms, or saunas. Wear your seat belt at all times when driving. Avoid raw meat, uncooked cheese, cat litter boxes, and soil used by cats. These carry germs that can cause birth defects in the baby. Take your prenatal vitamins. Try taking a stool softener (if your caregiver approves) if you develop constipation. Eat more high-fiber foods, such as fresh vegetables or fruit and whole grains. Drink plenty of fluids to keep your urine clear or pale yellow. Take warm sitz baths to soothe any pain or discomfort caused by hemorrhoids. Use hemorrhoid cream if   your caregiver approves. If you develop varicose veins, wear support hose. Elevate your feet for 15 minutes, 3-4 times a day. Limit salt in your diet. Avoid heavy lifting, wear low heal shoes, and practice good posture. Rest a lot with your legs elevated if you have leg cramps or low back pain. Visit your dentist if you have not gone during your pregnancy. Use a soft toothbrush to brush your teeth and be gentle when you floss. A sexual relationship may be continued unless your caregiver directs you otherwise. Do not travel far distances unless it is absolutely necessary and only with the approval of your caregiver. Take prenatal classes to understand, practice, and ask questions about the labor and delivery. Make a trial run to the hospital. Pack your hospital bag. Prepare the baby's nursery. Continue to go to all your prenatal visits as directed by your caregiver. SEEK MEDICAL CARE IF: You are unsure if you are in labor or if your water has broken. You have dizziness. You have mild pelvic cramps, pelvic pressure, or nagging pain in your abdominal area. You have persistent nausea, vomiting, or diarrhea. You have a bad smelling vaginal discharge. You have pain with urination. SEEK IMMEDIATE MEDICAL CARE IF:  You  have a fever. You are leaking fluid from your vagina. You have spotting or bleeding from your vagina. You have severe abdominal cramping or pain. You have rapid weight loss or gain. You have shortness of breath with chest pain. You notice sudden or extreme swelling of your face, hands, ankles, feet, or legs. You have not felt your baby move in over an hour. You have severe headaches that do not go away with medicine. You have vision changes. Document Released: 10/14/2001 Document Revised: 10/25/2013 Document Reviewed: 12/21/2012 [facility] Patient Information 2015 [place], [place]. This information is not intended to replace advice given to you by your health care provider. Make sure you discuss any questions you have with your health care provider.

## 2023-01-06 DIAGNOSIS — Z3483 Encounter for supervision of other normal pregnancy, third trimester: Secondary | ICD-10-CM | POA: Diagnosis not present

## 2023-01-06 DIAGNOSIS — Z3482 Encounter for supervision of other normal pregnancy, second trimester: Secondary | ICD-10-CM | POA: Diagnosis not present

## 2023-01-07 ENCOUNTER — Encounter: Payer: Self-pay | Admitting: Advanced Practice Midwife

## 2023-01-07 ENCOUNTER — Ambulatory Visit (INDEPENDENT_AMBULATORY_CARE_PROVIDER_SITE_OTHER): Payer: 59 | Admitting: Advanced Practice Midwife

## 2023-01-07 VITALS — BP 119/72 | HR 101 | Wt 262.0 lb

## 2023-01-07 DIAGNOSIS — Z3403 Encounter for supervision of normal first pregnancy, third trimester: Secondary | ICD-10-CM

## 2023-01-07 DIAGNOSIS — Z3A34 34 weeks gestation of pregnancy: Secondary | ICD-10-CM

## 2023-01-07 NOTE — Patient Instructions (Signed)
Artia, thank you for choosing our office today! We appreciate the opportunity to meet your healthcare needs. You may receive a short survey by mail, e-mail, or through MyChart. If you are happy with your care we would appreciate if you could take just a few minutes to complete the survey questions. We read all of your comments and take your feedback very seriously. Thank you again for choosing our office.  [facility] ([address] [place], [place] [address]) Entrance C, located off of [address] Free 24/7 valet parking   CLASSES: Go to [email or web address] to register for classes (childbirth, breastfeeding, waterbirth, infant CPR, daddy bootcamp, etc.)  Call the office ([phone]) or go to [facility] if: You begin to have strong, frequent contractions Your water breaks.  Sometimes it is a big gush of fluid, sometimes it is just a trickle that keeps getting your panties wet or running down your legs You have vaginal bleeding.  It is normal to have a small amount of spotting if your cervix was checked.  You don't feel your baby moving like normal.  If you don't, get you something to eat and drink and lay down and focus on feeling your baby move.   If your baby is still not moving like normal, you should call the office or go to [facility].  Call the office ([phone]) or go to [facility] for these signs of pre-eclampsia: Severe headache that does not go away with Tylenol Visual changes- seeing spots, double, blurred vision Pain under your right breast or upper abdomen that does not go away with Tums or heartburn medicine Nausea and/or vomiting Severe swelling in your hands, feet, and face   Tdap Vaccine It is recommended that you get the Tdap vaccine during the third trimester of EACH pregnancy to help protect your baby from getting pertussis (whooping cough) 27-36 weeks is the BEST time to do  this so that you can pass the protection on to your baby. During pregnancy is better than after pregnancy, but if you are unable to get it during pregnancy it will be offered at the hospital.  You can get this vaccine with us, at the health department, your family doctor, or some local pharmacies Everyone who will be around your baby should also be up-to-date on their vaccines before the baby comes. Adults (who are not pregnant) only need 1 dose of Tdap during adulthood.   [place] [facility] Doctors [facility] ([facility]): [address], [phone]           [facility]: [address], [phone]                [facility] ([facility]): [address], [phone] (call to ask if accepting patients) [facility]: [address], [place], [phone]    [place] [facility]/Family Doctors [facility] ([facility]): [address], [phone] [facility]: [address], [phone] [facility]: [address], [phone]  [place] [facility]  [facility] ([facility]): [phone] [facility]: [address], [phone]   [place] [facility]: [address], [phone]  [place] [facility]  [facility]: [address], [phone]  Home Blood Pressure Monitoring for Patients   Your provider has recommended that you check your   blood pressure (BP) at least once a week at home. If you do not have a blood pressure cuff at home, one will be provided for you. Contact your provider if you have not received your monitor within 1 week.   Helpful Tips for Accurate Home Blood Pressure Checks  Don't smoke, exercise, or drink caffeine 30 minutes before checking your BP Use the restroom before checking your BP (a full bladder can raise your  pressure) Relax in a comfortable upright chair Feet on the ground Left arm resting comfortably on a flat surface at the level of your heart Legs uncrossed Back supported Sit quietly and don't talk Place the cuff on your bare arm Adjust snuggly, so that only two fingertips can fit between your skin and the top of the cuff Check 2 readings separated by at least one minute Keep a log of your BP readings For a visual, please reference this diagram: http://ccnc.care/bpdiagram  Provider Name: [facility]     Phone: [phone]  Zone 1: ALL CLEAR  Continue to monitor your symptoms:  BP reading is less than 140 (top number) or less than 90 (bottom number)  No right upper stomach pain No headaches or seeing spots No feeling nauseated or throwing up No swelling in face and hands  Zone 2: CAUTION Call your doctor's office for any of the following:  BP reading is greater than 140 (top number) or greater than 90 (bottom number)  Stomach pain under your ribs in the middle or right side Headaches or seeing spots Feeling nauseated or throwing up Swelling in face and hands  Zone 3: EMERGENCY  Seek immediate medical care if you have any of the following:  BP reading is greater than160 (top number) or greater than 110 (bottom number) Severe headaches not improving with Tylenol Serious difficulty catching your breath Any worsening symptoms from Zone 2   Third Trimester of Pregnancy The third trimester is from week 29 through week 42, months 7 through 9. The third trimester is a time when the fetus is growing rapidly. At the end of the ninth month, the fetus is about 20 inches in length and weighs 6-10 pounds.  BODY CHANGES Your body goes through many changes during pregnancy. The changes vary from woman to woman.  Your weight will continue to increase. You can expect to gain 25-35 pounds (11-16 kg) by the end of the pregnancy. You may begin to get stretch marks on your hips, abdomen,  and breasts. You may urinate more often because the fetus is moving lower into your pelvis and pressing on your bladder. You may develop or continue to have heartburn as a result of your pregnancy. You may develop constipation because certain hormones are causing the muscles that push waste through your intestines to slow down. You may develop hemorrhoids or swollen, bulging veins (varicose veins). You may have pelvic pain because of the weight gain and pregnancy hormones relaxing your joints between the bones in your pelvis. Backaches may result from overexertion of the muscles supporting your posture. You may have changes in your hair. These can include thickening of your hair, rapid growth, and changes in texture. Some women also have hair loss during or after pregnancy, or hair that feels dry or thin. Your hair will most likely return to normal after your baby is born. Your breasts will continue to grow and be tender. A yellow discharge may leak from your breasts called colostrum. Your belly button may stick out. You may   feel short of breath because of your expanding uterus. You may notice the fetus "dropping," or moving lower in your abdomen. You may have a bloody mucus discharge. This usually occurs a few days to a week before labor begins. Your cervix becomes thin and soft (effaced) near your due date. WHAT TO EXPECT AT YOUR PRENATAL EXAMS  You will have prenatal exams every 2 weeks until week 36. Then, you will have weekly prenatal exams. During a routine prenatal visit: You will be weighed to make sure you and the fetus are growing normally. Your blood pressure is taken. Your abdomen will be measured to track your baby's growth. The fetal heartbeat will be listened to. Any test results from the previous visit will be discussed. You may have a cervical check near your due date to see if you have effaced. At around 36 weeks, your caregiver will check your cervix. At the same time, your  caregiver will also perform a test on the secretions of the vaginal tissue. This test is to determine if a type of bacteria, Group B streptococcus, is present. Your caregiver will explain this further. Your caregiver may ask you: What your birth plan is. How you are feeling. If you are feeling the baby move. If you have had any abnormal symptoms, such as leaking fluid, bleeding, severe headaches, or abdominal cramping. If you have any questions. Other tests or screenings that may be performed during your third trimester include: Blood tests that check for low iron levels (anemia). Fetal testing to check the health, activity level, and growth of the fetus. Testing is done if you have certain medical conditions or if there are problems during the pregnancy. FALSE LABOR You may feel small, irregular contractions that eventually go away. These are called Braxton Hicks contractions, or false labor. Contractions may last for hours, days, or even weeks before true labor sets in. If contractions come at regular intervals, intensify, or become painful, it is best to be seen by your caregiver.  SIGNS OF LABOR  Menstrual-like cramps. Contractions that are 5 minutes apart or less. Contractions that start on the top of the uterus and spread down to the lower abdomen and back. A sense of increased pelvic pressure or back pain. A watery or bloody mucus discharge that comes from the vagina. If you have any of these signs before the 37th week of pregnancy, call your caregiver right away. You need to go to the hospital to get checked immediately. HOME CARE INSTRUCTIONS  Avoid all smoking, herbs, alcohol, and unprescribed drugs. These chemicals affect the formation and growth of the baby. Follow your caregiver's instructions regarding medicine use. There are medicines that are either safe or unsafe to take during pregnancy. Exercise only as directed by your caregiver. Experiencing uterine cramps is a good sign to  stop exercising. Continue to eat regular, healthy meals. Wear a good support bra for breast tenderness. Do not use hot tubs, steam rooms, or saunas. Wear your seat belt at all times when driving. Avoid raw meat, uncooked cheese, cat litter boxes, and soil used by cats. These carry germs that can cause birth defects in the baby. Take your prenatal vitamins. Try taking a stool softener (if your caregiver approves) if you develop constipation. Eat more high-fiber foods, such as fresh vegetables or fruit and whole grains. Drink plenty of fluids to keep your urine clear or pale yellow. Take warm sitz baths to soothe any pain or discomfort caused by hemorrhoids. Use hemorrhoid cream if   your caregiver approves. If you develop varicose veins, wear support hose. Elevate your feet for 15 minutes, 3-4 times a day. Limit salt in your diet. Avoid heavy lifting, wear low heal shoes, and practice good posture. Rest a lot with your legs elevated if you have leg cramps or low back pain. Visit your dentist if you have not gone during your pregnancy. Use a soft toothbrush to brush your teeth and be gentle when you floss. A sexual relationship may be continued unless your caregiver directs you otherwise. Do not travel far distances unless it is absolutely necessary and only with the approval of your caregiver. Take prenatal classes to understand, practice, and ask questions about the labor and delivery. Make a trial run to the hospital. Pack your hospital bag. Prepare the baby's nursery. Continue to go to all your prenatal visits as directed by your caregiver. SEEK MEDICAL CARE IF: You are unsure if you are in labor or if your water has broken. You have dizziness. You have mild pelvic cramps, pelvic pressure, or nagging pain in your abdominal area. You have persistent nausea, vomiting, or diarrhea. You have a bad smelling vaginal discharge. You have pain with urination. SEEK IMMEDIATE MEDICAL CARE IF:  You  have a fever. You are leaking fluid from your vagina. You have spotting or bleeding from your vagina. You have severe abdominal cramping or pain. You have rapid weight loss or gain. You have shortness of breath with chest pain. You notice sudden or extreme swelling of your face, hands, ankles, feet, or legs. You have not felt your baby move in over an hour. You have severe headaches that do not go away with medicine. You have vision changes. Document Released: 10/14/2001 Document Revised: 10/25/2013 Document Reviewed: 12/21/2012 ExitCare Patient Information 2015 [employer]. This information is not intended to replace advice given to you by your health care provider. Make sure you discuss any questions you have with your health care provider.       

## 2023-01-07 NOTE — Progress Notes (Signed)
   LOW-RISK PREGNANCY VISIT Patient name: Monique Fox MRN [id number]  Date of birth: 07-08-1996 Chief Complaint:   Routine Prenatal Visit  History of Present Illness:   Monique Fox is a 27 y.o. G31P0000 female at 31w3dwith an Estimated Date of Delivery: 02/15/23 being seen today for ongoing management of a low-risk pregnancy.  Today she reports  doing well but 3rd trimester discomforts are increasing; may want to start FMLA around 02/07/23 . Contractions: Not present.  .  Movement: Present. denies leaking of fluid. Review of Systems:   Pertinent items are noted in HPI Denies abnormal vaginal discharge w/ itching/odor/irritation, headaches, visual changes, shortness of breath, chest pain, abdominal pain, severe nausea/vomiting, or problems with urination or bowel movements unless otherwise stated above. Pertinent History Reviewed:  Reviewed past medical,surgical, social, obstetrical and family history.  Reviewed problem list, medications and allergies. Physical Assessment:   Vitals:   01/07/23 1114  BP: 119/72  Pulse: (!) 101  Weight: 262 lb (118.8 kg)  Body mass index is 47.16 kg/m.        Physical Examination:   General appearance: Well appearing, and in no distress  Mental status: Alert, oriented to person, place, and time  Skin: Warm & dry  Cardiovascular: Normal heart rate noted  Respiratory: Normal respiratory effort, no distress  Abdomen: Soft, gravid, nontender  Pelvic: Cervical exam deferred         Extremities: Edema: None  Fetal Status: Fetal Heart Rate (bpm): 154 Fundal Height: 36 cm Movement: Present    No results found for this or any previous visit (from the past 24 hour(s)).  Assessment & Plan:  1) Low-risk pregnancy G1P0000 at 365w3dith an Estimated Date of Delivery: 02/15/23   2) BMI pre-p 47, start ante testing @ 36wks (already scheduled); isn't interested in IOL unless indicated by BP or something else besides BMI   Meds: No orders of the defined types  were placed in this encounter.  Labs/procedures today: none  Plan:  Continue routine obstetrical care; GBS/cultures @ next visit  Reviewed: Preterm labor symptoms and general obstetric precautions including but not limited to vaginal bleeding, contractions, leaking of fluid and fetal movement were reviewed in detail with the patient.  All questions were answered. Has home bp cuff. Check bp weekly, let usKoreanow if >140/90.   Follow-up: Return for As scheduled.  No orders of the defined types were placed in this encounter.  KiMyrtis SerNM 01/07/2023 11:32 AM

## 2023-01-13 ENCOUNTER — Encounter: Payer: Self-pay | Admitting: Obstetrics & Gynecology

## 2023-01-14 ENCOUNTER — Encounter: Payer: Self-pay | Admitting: *Deleted

## 2023-01-19 ENCOUNTER — Ambulatory Visit (INDEPENDENT_AMBULATORY_CARE_PROVIDER_SITE_OTHER): Payer: 59 | Admitting: *Deleted

## 2023-01-19 VITALS — BP 137/80 | HR 94 | Wt 264.0 lb

## 2023-01-19 DIAGNOSIS — Z3A36 36 weeks gestation of pregnancy: Secondary | ICD-10-CM

## 2023-01-19 DIAGNOSIS — Z6841 Body Mass Index (BMI) 40.0 and over, adult: Secondary | ICD-10-CM | POA: Diagnosis not present

## 2023-01-19 DIAGNOSIS — Z3403 Encounter for supervision of normal first pregnancy, third trimester: Secondary | ICD-10-CM | POA: Diagnosis not present

## 2023-01-19 NOTE — Progress Notes (Addendum)
   NURSE VISIT- NST  SUBJECTIVE:  Monique Fox is a 27 y.o. G1P0000 female at [redacted]w[redacted]d, here for a NST for pregnancy complicated by BMI [id number].  She reports active fetal movement, contractions: none, vaginal bleeding: none, membranes: intact.   OBJECTIVE:  BP 137/80   Pulse 94   Wt 264 lb (119.7 kg)   LMP 05/02/2022   BMI 47.52 kg/m   Appears well, no apparent distress  No results found for this or any previous visit (from the past 24 hour(s)).  NST: FHR baseline 150 bpm, Variability: moderate, Accelerations:present, Decelerations:  Absent= Cat 1/reactive Toco: none   ASSESSMENT: G1P0000 at [redacted]w[redacted]d with Morbid obesity (BMI >=40) NST reactive  PLAN: EFM strip reviewed by Nigel Berthold, CNM   Recommendations: keep next appointment as scheduled    [name]  01/19/2023 10:55 AM  Chart reviewed for nurse visit. Agree with plan of care.  Christin Fudge, [place] 01/19/2023 12:41 PM

## 2023-01-20 ENCOUNTER — Other Ambulatory Visit: Payer: Self-pay

## 2023-01-20 ENCOUNTER — Inpatient Hospital Stay (HOSPITAL_COMMUNITY): Payer: 59 | Admitting: Anesthesiology

## 2023-01-20 ENCOUNTER — Encounter (HOSPITAL_COMMUNITY): Payer: Self-pay | Admitting: Family Medicine

## 2023-01-20 ENCOUNTER — Inpatient Hospital Stay (HOSPITAL_COMMUNITY)
Admission: AD | Admit: 2023-01-20 | Discharge: 2023-01-23 | DRG: 806 | Disposition: A | Discharge status: Home / Self Care | Payer: 59 | Attending: Obstetrics & Gynecology | Admitting: Obstetrics & Gynecology

## 2023-01-20 DIAGNOSIS — E669 Obesity, unspecified: Secondary | ICD-10-CM | POA: Diagnosis not present

## 2023-01-20 DIAGNOSIS — O99214 Obesity complicating childbirth: Secondary | ICD-10-CM | POA: Diagnosis not present

## 2023-01-20 DIAGNOSIS — Z7951 Long term (current) use of inhaled steroids: Secondary | ICD-10-CM

## 2023-01-20 DIAGNOSIS — O42913 Preterm premature rupture of membranes, unspecified as to length of time between rupture and onset of labor, third trimester: Secondary | ICD-10-CM | POA: Diagnosis not present

## 2023-01-20 DIAGNOSIS — Z3400 Encounter for supervision of normal first pregnancy, unspecified trimester: Secondary | ICD-10-CM

## 2023-01-20 DIAGNOSIS — Z7982 Long term (current) use of aspirin: Secondary | ICD-10-CM

## 2023-01-20 DIAGNOSIS — O42013 Preterm premature rupture of membranes, onset of labor within 24 hours of rupture, third trimester: Secondary | ICD-10-CM | POA: Diagnosis not present

## 2023-01-20 DIAGNOSIS — J45909 Unspecified asthma, uncomplicated: Secondary | ICD-10-CM | POA: Diagnosis not present

## 2023-01-20 DIAGNOSIS — Z3A36 36 weeks gestation of pregnancy: Secondary | ICD-10-CM | POA: Diagnosis not present

## 2023-01-20 DIAGNOSIS — O9952 Diseases of the respiratory system complicating childbirth: Secondary | ICD-10-CM | POA: Diagnosis not present

## 2023-01-20 DIAGNOSIS — Z6841 Body Mass Index (BMI) 40.0 and over, adult: Secondary | ICD-10-CM

## 2023-01-20 LAB — COMPREHENSIVE METABOLIC PANEL WITH GFR
ALT: 11 U/L (ref 0–44)
AST: 21 U/L (ref 15–41)
Albumin: 2.4 g/dL — ABNORMAL LOW (ref 3.5–5.0)
Alkaline Phosphatase: 126 U/L (ref 38–126)
Anion gap: 9 (ref 5–15)
BUN: <5 mg/dL — ABNORMAL LOW (ref 6–20)
CO2: 19 mmol/L — ABNORMAL LOW (ref 22–32)
Calcium: 7.8 mg/dL — ABNORMAL LOW (ref 8.9–10.3)
Chloride: 108 mmol/L (ref 98–111)
Creatinine, Ser: 0.56 mg/dL (ref 0.44–1.00)
GFR, Estimated: >60 mL/min
Glucose, Bld: 89 mg/dL (ref 70–99)
Potassium: 4 mmol/L (ref 3.5–5.1)
Sodium: 136 mmol/L (ref 135–145)
Total Bilirubin: 0.6 mg/dL (ref 0.3–1.2)
Total Protein: 6.2 g/dL — ABNORMAL LOW (ref 6.5–8.1)

## 2023-01-20 LAB — CBC
HCT: 34.6 % — ABNORMAL LOW (ref 36.0–46.0)
Hemoglobin: 11.6 g/dL — ABNORMAL LOW (ref 12.0–15.0)
MCH: 29.1 pg (ref 26.0–34.0)
MCHC: 33.5 g/dL (ref 30.0–36.0)
MCV: 86.7 fL (ref 80.0–100.0)
Platelets: 281 10*3/uL (ref 150–400)
RBC: 3.99 MIL/uL (ref 3.87–5.11)
RDW: 13 % (ref 11.5–15.5)
WBC: 11.4 10*3/uL — ABNORMAL HIGH (ref 4.0–10.5)
nRBC: 0 % (ref 0.0–0.2)

## 2023-01-20 LAB — RPR: RPR Ser Ql: NONREACTIVE

## 2023-01-20 LAB — POCT FERN TEST: POCT Fern Test: POSITIVE

## 2023-01-20 MED ORDER — ONDANSETRON HCL 4 MG/2ML IJ SOLN
4.0000 mg | Freq: Four times a day (QID) | INTRAMUSCULAR | Status: DC | PRN
  Administered 2023-01-21 (×2): 4 mg via INTRAVENOUS
  Filled 2023-01-20 (×2): qty 2

## 2023-01-20 MED ORDER — FENTANYL-BUPIVACAINE-NACL 0.5-0.125-0.9 MG/250ML-% EP SOLN
12.0000 mL/h | EPIDURAL | Status: DC | PRN
  Filled 2023-01-20: qty 250

## 2023-01-20 MED ORDER — LACTATED RINGERS IV SOLN: 500.0000 mL | INTRAVENOUS | Status: DC | PRN

## 2023-01-20 MED ORDER — LACTATED RINGERS IV SOLN
500.0000 mL | Freq: Once | INTRAVENOUS | Status: AC
  Administered 2023-01-20: 500 mL via INTRAVENOUS

## 2023-01-20 MED ORDER — FENTANYL-BUPIVACAINE-NACL 0.5-0.125-0.9 MG/250ML-% EP SOLN
EPIDURAL | Status: DC | PRN
  Administered 2023-01-20: 12 mL/h via EPIDURAL

## 2023-01-20 MED ORDER — OXYCODONE-ACETAMINOPHEN 5-325 MG PO TABS: 2.0000 | ORAL_TABLET | ORAL | Status: DC | PRN

## 2023-01-20 MED ORDER — DIPHENHYDRAMINE HCL 50 MG/ML IJ SOLN: 12.5000 mg | INTRAMUSCULAR | Status: DC | PRN

## 2023-01-20 MED ORDER — LACTATED RINGERS IV SOLN: INTRAVENOUS | Status: DC

## 2023-01-20 MED ORDER — PHENYLEPHRINE 80 MCG/ML (10ML) SYRINGE FOR IV PUSH (FOR BLOOD PRESSURE SUPPORT): 80.0000 ug | PREFILLED_SYRINGE | INTRAVENOUS | Status: DC | PRN

## 2023-01-20 MED ORDER — OXYTOCIN-SODIUM CHLORIDE 30-0.9 UT/500ML-% IV SOLN
2.5000 [IU]/h | INTRAVENOUS | Status: DC
  Administered 2023-01-21: 2.5 [IU]/h via INTRAVENOUS
  Filled 2023-01-20: qty 500

## 2023-01-20 MED ORDER — ACETAMINOPHEN 325 MG PO TABS: 650.0000 mg | ORAL_TABLET | ORAL | Status: DC | PRN

## 2023-01-20 MED ORDER — EPHEDRINE 5 MG/ML INJ: 10.0000 mg | INTRAVENOUS | Status: DC | PRN

## 2023-01-20 MED ORDER — FENTANYL CITRATE (PF) 100 MCG/2ML IJ SOLN: 50.0000 ug | INTRAMUSCULAR | Status: DC | PRN

## 2023-01-20 MED ORDER — LIDOCAINE HCL (PF) 1 % IJ SOLN
INTRAMUSCULAR | Status: DC | PRN
  Administered 2023-01-20: 5 mL via EPIDURAL

## 2023-01-20 MED ORDER — LIDOCAINE HCL (PF) 1 % IJ SOLN: 30.0000 mL | INTRAMUSCULAR | Status: DC | PRN

## 2023-01-20 MED ORDER — SOD CITRATE-CITRIC ACID 500-334 MG/5ML PO SOLN: 30.0000 mL | ORAL | Status: DC | PRN

## 2023-01-20 MED ORDER — PENICILLIN G POT IN DEXTROSE 60000 UNIT/ML IV SOLN
3.0000 10*6.[IU] | INTRAVENOUS | Status: DC
  Administered 2023-01-20 – 2023-01-21 (×5): 3 10*6.[IU] via INTRAVENOUS
  Filled 2023-01-20 (×6): qty 50

## 2023-01-20 MED ORDER — OXYTOCIN BOLUS FROM INFUSION
333.0000 mL | Freq: Once | INTRAVENOUS | Status: AC
  Administered 2023-01-21: 333 mL via INTRAVENOUS

## 2023-01-20 MED ORDER — SODIUM CHLORIDE 0.9 % IV SOLN
5.0000 10*6.[IU] | Freq: Once | INTRAVENOUS | Status: AC
  Administered 2023-01-20: 5 10*6.[IU] via INTRAVENOUS
  Filled 2023-01-20: qty 5

## 2023-01-20 MED ORDER — OXYCODONE-ACETAMINOPHEN 5-325 MG PO TABS: 1.0000 | ORAL_TABLET | ORAL | Status: DC | PRN

## 2023-01-20 NOTE — Progress Notes (Signed)
Patient ID: Monique Fox, female   DOB: [birth year], 27 y.o.   MRN: [id number] Monique Fox is a 27 y.o. G1P0000 at [redacted]w[redacted]d admitted for PPROM and early labor  Subjective: no complaints and comfortable with epidural  Objective: BP 116/80   Pulse (!) 107   Temp 98.1 F (36.7 C) (Oral)   Resp 18   Ht 5\' 3"  (1.6 m)   Wt 119.3 kg   LMP 05/02/2022   SpO2 99%   BMI 46.61 kg/m  No intake/output data recorded.  FHR baseline 145 bpm, Variability: moderate, Accelerations:present, Decelerations:  Absent Toco: q 1-4 mins   SVE:   Dilation: 3.5 Effacement (%): 90 Station: -2, -1 Exam by:: Lupita Shutter, RN  Labs: Lab Results  Component Value Date   WBC 11.4 (H) 01/20/2023   HGB 11.6 (L) 01/20/2023   HCT 34.6 (L) 01/20/2023   MCV 86.7 01/20/2023   PLT 281 01/20/2023    Assessment / Plan: PPROM clear fluid 0700, contractions started after, making cervical change on her own. Will continue expectant management. Pit if needed.   Labor: early Fetal Wellbeing:  Category I Pain Control:  epidural Pre-eclampsia: N/A I/D:   PCN for GBS unknown and preterm Anticipated MOD: NSVB  Roma Schanz CNM, WHNP-BC 01/20/2023, 9:03 PM

## 2023-01-20 NOTE — Progress Notes (Signed)
Labor Progress Note  Monique Fox is a 27 y.o. G1P0000 at [redacted]w[redacted]d presented for PPROM.  S: She is doing well. She is starting to feel contractions more strongly. Feels they are coming about every 3-5 min with an occasional 41min break.  O:  BP 135/84   Pulse 95   Temp 97.9 F (36.6 C) (Oral)   Resp 18   Ht 5\' 3"  (1.6 m)   Wt 119.3 kg   LMP 05/02/2022   SpO2 99%   BMI 46.61 kg/m  EFM: 140bpm/Moderate variability/ 10x10 accels/ None decels  CVE: Dilation: 3 Effacement (%): 60 Station: -2 Presentation: Vertex Exam by:: Phil Dopp, RN   A&P: 27 y.o. G1P0000 [redacted]w[redacted]d here for PPROM as above.  #Labor: expectant management, if not making progress at next SVE can offer augmentation #Pain: per patient request #FWB: CAT 1 #GBS  unknown - PCN  Enid Baas, MD [place] [facility], [facility] [place] [facility] 01/20/23  5:53 PM

## 2023-01-20 NOTE — H&P (Signed)
OBSTETRIC ADMISSION HISTORY AND PHYSICAL  Monique Fox is a 27 y.o. female G1P0000 with IUP at [redacted]w[redacted]d by [place] presenting for PPROM. She reports +FMs, No LOF, no VB, no blurry vision, headaches or peripheral edema, and RUQ pain.  She plans on breast feeding. She request condoms for birth control. She received her prenatal care at [facility]   Dating: By 6wk [place] --->  Estimated Date of Delivery: 02/15/23  Sono:    @[redacted]w[redacted]d , CWD, normal anatomy, cephalic presentation, [id number], 79% EFW   Prenatal History/Complications:  Patient Active Problem List   Diagnosis Date Noted   Preterm labor 01/20/2023   Morbid obesity with BMI of 50.0-59.9, adult ([place]) 12/08/2022   Fibroid 09/30/2022   Supervision of normal first pregnancy 08/01/2022   Anxiety 06/09/2022     FAMILY TREE  RESULTS  Language English Pap 7/21 @ [facility] neg per pt-requested 2/21   Initiated care at 12wks GC/CT Initial: -/-           36wks:  Dating by 6wk [place]    Support person [place] NT/IT: neg    AFP:      Panorama: LR female  BP cuff Has bp cuff Carrier Screen neg    [place]/Hgb Elec neg  Rhogam n/a    TDaP vaccine 12/24/2022  Blood Type O/Positive/-- (10/02 1124)  Flu vaccine Oct 2023 w/ work Antibody Negative (10/02 1124)  Covid vaccine  HBsAg Negative (10/02 1124)    RPR Non Reactive (10/02 1124)  Anatomy US Female 'Ovid Curd' Rubella  10.30 (10/02 1124)  Feeding Plan breast HIV Non Reactive (10/02 1124)  Contraception Condoms Hep C neg  Circumcision yes    Pediatrician Dr Wolfgang Phoenix Linna Hoff Fam) A1C/GTT Early:      26-28wks: normal  Prenatal Classes discussed      GBS     [ ]  PCN allergy  BTL Consent     VBAC Consent n/a PHQ9 & GAD7  [ ] New OB  [ ] 28wks   [ ] 36wks  Waterbirth [ ] Class [ ]  36wkCNM visit/consent      Past Medical History: Past Medical History:  Diagnosis Date   Anemia    Anxiety    Asthma     Past Surgical History: Past Surgical History:  Procedure Laterality Date   NO PAST SURGERIES       Obstetrical History: OB History     Gravida  1   Para  0   Term  0   Preterm  0   AB  0   Living  0      SAB  0   IAB  0   Ectopic  0   Multiple  0   Live Births  0           Social History Social History   Socioeconomic History   Marital status: Married    Spouse name: Not on file   Number of children: Not on file   Years of education: Not on file   Highest education level: Not on file  Occupational History   Not on file  Tobacco Use   Smoking status: Never   Smokeless tobacco: Never  Vaping Use   Vaping Use: Never used  Substance and Sexual Activity   Alcohol use: Not Currently    Comment: rarely   Drug use: Never   Sexual activity: Yes    Birth control/protection: None  Other Topics Concern   Not on file  Social History Narrative   ** Merged  History Encounter **       Social Determinants of Health   Financial Resource Strain: Low Risk  (08/04/2022)   Overall Financial Resource Strain (CARDIA)    Difficulty of Paying Living Expenses: Not hard at all  Food Insecurity: No Food Insecurity (08/04/2022)   Hunger Vital Sign    Worried About Running Out of Food in the Last Year: Never true    Ran Out of Food in the Last Year: Never true  Transportation Needs: No Transportation Needs (08/04/2022)   PRAPARE - Hydrologist (Medical): No    Lack of Transportation (Non-Medical): No  Physical Activity: Sufficiently Active (08/04/2022)   Exercise Vital Sign    Days of Exercise per Week: 4 days    Minutes of Exercise per Session: 60 min  Stress: No Stress Concern Present (08/04/2022)   [place]    Feeling of Stress : Not at all  Social Connections: [place] (08/04/2022)   Social Connection and Isolation Panel [NHANES]    Frequency of Communication with Friends and Family: More than three times a week    Frequency of Social Gatherings with  Friends and Family: Twice a week    Attends Religious Services: More than 4 times per year    Active Member of [employer] or Organizations: Yes    Attends Music therapist: More than 4 times per year    Marital Status: Married    Family History: Family History  Problem Relation Age of Onset   Hypertension Maternal Aunt    Diabetes Maternal Aunt    Hypertension Maternal Uncle    Diabetes Maternal Uncle    Diabetes Maternal Grandmother    Stroke Maternal Grandmother    Cancer Maternal Grandfather        sinus   Cancer Paternal Grandmother        cervical, abdominal, colon   Breast cancer Paternal Grandmother     Allergies: Allergies  Allergen Reactions   Pineapple Hives and Itching    Medications Prior to Admission  Medication Sig Dispense Refill Last Dose   albuterol (VENTOLIN HFA) 108 (90 Base) MCG/ACT inhaler Inhale 2 puffs into the lungs every 6 (six) hours as needed for wheezing or shortness of breath. 8 g 2 Past Week   aspirin EC 81 MG tablet Take 1 tablet (81 mg total) by mouth daily. Swallow whole. (Patient taking differently: Take 162 mg by mouth daily. Swallow whole.) 90 tablet 3 01/19/2023   Prenatal Vit-Fe Fumarate-FA (PNV PRENATAL PLUS MULTIVITAMIN) 27-1 MG TABS Take 1 tablet by mouth daily. 30 tablet 11 01/19/2023   SYMBICORT 80-4.5 MCG/ACT inhaler Inhale 2 puffs into the lungs 2 (two) times daily.   Past Week   levocetirizine (XYZAL) 5 MG tablet Take 5 mg by mouth at bedtime. (Patient not taking: Reported on 08/04/2022)       Review of Systems   All systems reviewed and negative except as stated in HPI  Blood pressure 131/81, pulse 94, temperature 97.9 F (36.6 C), temperature source Oral, resp. rate 18, height 5\' 3"  (1.6 m), weight 119.3 kg, last menstrual period 05/02/2022, SpO2 100 %. General appearance: alert, cooperative, and appears stated age Lungs: clear to auscultation bilaterally Heart: regular rate and rhythm Abdomen: soft, non-tender; bowel  sounds normal Extremities: Homans sign is negative, no sign of DVT Presentation: cephalic Fetal monitoringBaseline: 150 bpm, Variability: Good {> 6 bpm), Accelerations: Reactive, and Decelerations: Absent Uterine activity:  occasional ctx     Prenatal labs: ABO, Rh: O/Positive/-- (10/02 1124) Antibody: Negative (01/22 0840) Rubella: 10.30 (10/02 1124) RPR: Non Reactive (01/22 0840)  HBsAg: Negative (10/02 1124)  HIV: Non Reactive (01/22 0840)  GBS:    1 hr Glucola nml Genetic screening  nml Anatomy US nml  Prenatal Transfer Tool  Maternal Diabetes: No Genetic Screening: Normal Maternal Ultrasounds/Referrals: Normal Fetal Ultrasounds or other Referrals:  None Maternal Substance Abuse:  No Significant Maternal Medications:  None Significant Maternal Lab Results:  None GBS unknown Number of Prenatal Visits:greater than 3 verified prenatal visits Other Comments:  None  Results for orders placed or performed during the hospital encounter of 01/20/23 (from the past 24 hour(s))  POCT fern test   Collection Time: 01/20/23  9:56 AM  Result Value Ref Range   POCT Fern Test Positive = ruptured amniotic membanes     Patient Active Problem List   Diagnosis Date Noted   Morbid obesity with BMI of 50.0-59.9, adult ([place]) 12/08/2022   Fibroid 09/30/2022   Supervision of normal first pregnancy 08/01/2022   Anxiety 06/09/2022    Assessment/Plan:  Enzley Burket is a 27 y.o. G1P0000 at [redacted]w[redacted]d here for PPROM  #Labor: expectant management #Pain: Per pt request #FWB: CAT 1 #ID:  GBS unknown- PCN started #MOF: Breast #MOC: condoms #Circ:  yes  Shelda Pal, DO  01/20/2023, 10:02 AM

## 2023-01-20 NOTE — Anesthesia Preprocedure Evaluation (Addendum)
Anesthesia Evaluation  Patient identified by MRN, date of birth, ID band Patient awake    Reviewed: Allergy & Precautions, NPO status , Patient's Chart, lab work & pertinent test results  Airway Mallampati: II  TM Distance: >3 FB Neck ROM: Full    Dental no notable dental hx. (+) Teeth Intact, Dental Advisory Given   Pulmonary asthma    Pulmonary exam normal breath sounds clear to auscultation       Cardiovascular negative cardio ROS Normal cardiovascular exam Rhythm:Regular Rate:Normal     Neuro/Psych negative neurological ROS     GI/Hepatic negative GI ROS, Neg liver ROS,,,  Endo/Other    Renal/GU negative Renal ROS     Musculoskeletal   Abdominal  (+) + obese (BMI 46.6)  Peds  Hematology Lab Results      Component                Value               Date                      WBC                      11.4 (H)            01/20/2023                HGB                      11.6 (L)            01/20/2023                HCT                      34.6 (L)            01/20/2023                MCV                      86.7                01/20/2023                PLT                      281                 01/20/2023              Anesthesia Other Findings   Reproductive/Obstetrics (+) Pregnancy                             Anesthesia Physical Anesthesia Plan  ASA: 3  Anesthesia Plan: Epidural   Post-op Pain Management:    Induction:   PONV Risk Score and Plan:   Airway Management Planned:   Additional Equipment:   Intra-op Plan:   Post-operative Plan:   Informed Consent: I have reviewed the patients History and Physical, chart, labs and discussed the procedure including the risks, benefits and alternatives for the proposed anesthesia with the patient or authorized representative who has indicated his/her understanding and acceptance.       Plan Discussed with:   Anesthesia  Plan Comments: (36.2 wk Primagravida w hx of asthma for LEA)  Anesthesia Quick Evaluation  

## 2023-01-20 NOTE — MAU Note (Signed)
.  Monique Fox is a 27 y.o. at 110w2d here in MAU reporting:   Contractions every: 7-10 minutes Onset of ctx: Today Pain score: 3/10  ROM: Possible ROM; clear watery fluid since 0700 Vaginal Bleeding: None Last SVE: n/a  Epidural: Undecided   Fetal Movement: Reports positive FM FHT:145 via Patient taken straight to room  Vitals:   01/20/23 0941  BP: 133/85  Pulse: 89  Resp: 18  Temp: 97.9 F (36.6 C)  SpO2: 100%       OB Office: Faculty GBS: Unknown; patient reports no GBS test yet Lab orders placed from triage: MAU Labor Eval

## 2023-01-20 NOTE — Anesthesia Procedure Notes (Signed)
Epidural Patient location during procedure: OB Start time: 01/20/2023 6:49 PM End time: 01/20/2023 7:04 PM  Staffing Anesthesiologist: Barnet Glasgow, MD Performed: anesthesiologist   Preanesthetic Checklist Completed: patient identified, IV checked, site marked, risks and benefits discussed, surgical consent, monitors and equipment checked, pre-op evaluation and timeout performed  Epidural Patient position: sitting Prep: DuraPrep and site prepped and draped Patient monitoring: continuous pulse ox and blood pressure Approach: midline Location: L3-L4 Injection technique: LOR air  Needle:  Needle type: Tuohy  Needle gauge: 17 G Needle length: 9 cm and 9 Needle insertion depth: 8 cm Catheter type: closed end flexible Catheter size: 19 Gauge Catheter at skin depth: 14 cm Test dose: negative  Assessment Events: blood not aspirated, no cerebrospinal fluid, injection not painful, no injection resistance, no paresthesia and negative IV test  Additional Notes Patient identified. Risks/Benefits/Options discussed with patient including but not limited to bleeding, infection, nerve damage, paralysis, failed block, incomplete pain control, headache, blood pressure changes, nausea, vomiting, reactions to medication both or allergic, itching and postpartum back pain. Confirmed with bedside nurse the patient's most recent platelet count. Confirmed with patient that they are not currently taking any anticoagulation, have any bleeding history or any family history of bleeding disorders. Patient expressed understanding and wished to proceed. All questions were answered. Sterile technique was used throughout the entire procedure. Please see nursing notes for vital signs. Test dose was given through epidural needle and negative prior to continuing to dose epidural or start infusion. Warning signs of high block given to the patient including shortness of breath, tingling/numbness in hands, complete  motor block, or any concerning symptoms with instructions to call for help. Patient was given instructions on fall risk and not to get out of bed. All questions and concerns addressed with instructions to call with any issues.  2 Attempt (S) . Patient tolerated procedure well.

## 2023-01-21 ENCOUNTER — Encounter (HOSPITAL_COMMUNITY): Payer: Self-pay | Admitting: Family Medicine

## 2023-01-21 DIAGNOSIS — Z3A36 36 weeks gestation of pregnancy: Secondary | ICD-10-CM | POA: Diagnosis not present

## 2023-01-21 DIAGNOSIS — O99214 Obesity complicating childbirth: Secondary | ICD-10-CM | POA: Diagnosis not present

## 2023-01-21 DIAGNOSIS — O42013 Preterm premature rupture of membranes, onset of labor within 24 hours of rupture, third trimester: Secondary | ICD-10-CM | POA: Diagnosis not present

## 2023-01-21 MED ORDER — PRENATAL MULTIVITAMIN CH
1.0000 | ORAL_TABLET | Freq: Every day | ORAL | Status: DC
  Administered 2023-01-21 – 2023-01-23 (×3): 1 via ORAL
  Filled 2023-01-21 (×3): qty 1

## 2023-01-21 MED ORDER — BENZOCAINE-MENTHOL 20-0.5 % EX AERO
1.0000 | INHALATION_SPRAY | CUTANEOUS | Status: DC | PRN
  Administered 2023-01-21: 1 via TOPICAL
  Filled 2023-01-21: qty 56

## 2023-01-21 MED ORDER — DIPHENHYDRAMINE HCL 25 MG PO CAPS: 25.0000 mg | ORAL_CAPSULE | Freq: Four times a day (QID) | ORAL | Status: DC | PRN

## 2023-01-21 MED ORDER — IBUPROFEN 600 MG PO TABS
600.0000 mg | ORAL_TABLET | Freq: Four times a day (QID) | ORAL | Status: DC
  Administered 2023-01-21 – 2023-01-23 (×9): 600 mg via ORAL
  Filled 2023-01-21 (×9): qty 1

## 2023-01-21 MED ORDER — WITCH HAZEL-GLYCERIN EX PADS: 1.0000 | MEDICATED_PAD | CUTANEOUS | Status: DC | PRN

## 2023-01-21 MED ORDER — ACETAMINOPHEN 325 MG PO TABS: 650.0000 mg | ORAL_TABLET | ORAL | Status: DC | PRN

## 2023-01-21 MED ORDER — TERBUTALINE SULFATE 1 MG/ML IJ SOLN: 0.2500 mg | Freq: Once | INTRAMUSCULAR | Status: DC | PRN

## 2023-01-21 MED ORDER — SIMETHICONE 80 MG PO CHEW: 80.0000 mg | CHEWABLE_TABLET | ORAL | Status: DC | PRN

## 2023-01-21 MED ORDER — ONDANSETRON HCL 4 MG/2ML IJ SOLN: 4.0000 mg | INTRAMUSCULAR | Status: DC | PRN

## 2023-01-21 MED ORDER — SENNOSIDES-DOCUSATE SODIUM 8.6-50 MG PO TABS
2.0000 | ORAL_TABLET | Freq: Every day | ORAL | Status: DC
  Administered 2023-01-22 – 2023-01-23 (×2): 2 via ORAL
  Filled 2023-01-21 (×2): qty 2

## 2023-01-21 MED ORDER — ZOLPIDEM TARTRATE 5 MG PO TABS: 5.0000 mg | ORAL_TABLET | Freq: Every evening | ORAL | Status: DC | PRN

## 2023-01-21 MED ORDER — OXYTOCIN-SODIUM CHLORIDE 30-0.9 UT/500ML-% IV SOLN
1.0000 m[IU]/min | INTRAVENOUS | Status: DC
  Administered 2023-01-21: 2 m[IU]/min via INTRAVENOUS

## 2023-01-21 MED ORDER — COCONUT OIL OIL
1.0000 | TOPICAL_OIL | Status: DC | PRN
  Administered 2023-01-21: 1 via TOPICAL

## 2023-01-21 MED ORDER — DIBUCAINE (PERIANAL) 1 % EX OINT: 1.0000 | TOPICAL_OINTMENT | CUTANEOUS | Status: DC | PRN

## 2023-01-21 MED ORDER — ONDANSETRON HCL 4 MG PO TABS: 4.0000 mg | ORAL_TABLET | ORAL | Status: DC | PRN

## 2023-01-21 NOTE — Lactation Note (Addendum)
This note was copied from a baby's chart. Lactation Consultation Note  Patient Name: Monique Fox [id number] Date: 01/21/2023 Age:28 hours Reason for consult: Initial assessment;1st time breastfeeding;Late-preterm 24-36.6wks  Birth Parent current feeding preference is : breastfeeding and supplementing with donor breast milk.   P1, LPTI with hypoglycemia ( low blood sugars), see results review-39 mg/dl at 1528 pm ( hypoglycemia). Birth Parent latched infant on her left breast using the cross cradle hold, infant latch and BF for 10 minutes, becoming sleepy. Infant was supplemented with 15 mls of EBM that Birth Parent pumped, infant was fed  using purple and clear extra slow flow bottle nipple. LC discussed with Support Person to pace feed and burp infant after every 5 mls of EBM. Birth Parent was set up with DEBP and will continue to pump every 3 hours for 15 minutes on initial setting. LC discussed LPTI feeding policy, Infant's input and output. Importance of maternal rest, diet and hydration. Birth Parent was made aware of O/P services, breastfeeding support groups, community resources, and our phone # for post-discharge questions.    Birth Parent understands that EBM is safe at room temperature for 4 hours whereas donor breast milk must be used within 1 hour. LC gave handout, "Storage and Preparation of Breast Milk".  Birth Parent also given "Caring for Your Special Baby", and 'My Care Plan" with LPTI feeding guidelines.  Birth Parent current feeding plan: 1- Birth Parent will continue to follow LPTI feeding policy, BF infant 8+ times within 24 hours, skin to skin, will limit chest feedings to 15 minutes or less and continue to supplement infant with any EBM first and then donor milk. Birth Parent  after latching infant at the breast , to continue to supplement infant on day 1 to offer 11mls+ of EBM/donor milk per feeding.  2- Birth Parent will continue to ask RN/LC for further latch assistance if  needed. 3- Birth Parent will continue to use DEBP every 3 hours for 15 minutes on initial setting, will offer infant any EBM first before formula.  Maternal Data Has patient been taught Hand Expression?: Yes Does the patient have breastfeeding experience prior to this delivery?: No  Feeding Mother's Current Feeding Choice: Breast Milk and Donor Milk  LATCH Score Latch: Grasps breast easily, tongue down, lips flanged, rhythmical sucking.  Audible Swallowing: A few with stimulation  Type of Nipple: Everted at rest and after stimulation  Comfort (Breast/Nipple): Soft / non-tender  Hold (Positioning): Assistance needed to correctly position infant at breast and maintain latch.  LATCH Score: 8   Lactation Tools Discussed/Used Tools: Pump;Flanges Flange Size: 21 Breast pump type: Double-Electric Breast Pump;Manual Pump Education: Setup, frequency, and cleaning;Milk Storage Reason for Pumping: Infant is LPTI, low blood sugars see result review Pumping frequency: Birth Parent will continue to use DEBP every 3 hours for 15 minutes on inital setting. Pumped volume: 15 mL  Interventions Interventions: Breast feeding basics reviewed;Adjust position;Assisted with latch;Support pillows;DEBP;Education;Position options;Skin to skin;Breast compression;Hand pump;LPT handout/interventions;LC Services brochure;Pace feeding;Breast massage;Expressed milk;Hand express  Discharge    Consult Status Consult Status: Follow-up Date: 01/22/23 Follow-up type: In-patient    Eulis Canner 01/21/2023, 5:14 PM

## 2023-01-21 NOTE — Anesthesia Postprocedure Evaluation (Signed)
Anesthesia Post Note  Patient: Meiko Defelice  Procedure(s) Performed: AN AD HOC LABOR EPIDURAL     Patient location during evaluation: Mother Baby Anesthesia Type: Epidural Level of consciousness: awake and alert Pain management: pain level controlled Vital Signs Assessment: post-procedure vital signs reviewed and stable Respiratory status: spontaneous breathing, nonlabored ventilation and respiratory function stable Cardiovascular status: stable Postop Assessment: no headache, no backache and epidural receding Anesthetic complications: no   No notable events documented.  Last Vitals:  Vitals:   01/21/23 1218 01/21/23 1621  BP: (!) 117/56 133/73  Pulse: (!) 109 99  Resp: 18 17  Temp:  37.1 C  SpO2: 99% 98%    Last Pain:  Vitals:   01/21/23 1849  TempSrc:   PainSc: 3    Pain Goal: Patients Stated Pain Goal: 3 (01/21/23 1113)                 Birdena Crandall, Velvet Bathe

## 2023-01-21 NOTE — Progress Notes (Signed)
Patient ID: Monique Fox, female   DOB: 07/25/1996, 26 y.o.   MRN: [id number] Called by RN, having a hard time tracing FHR and UC's and thinks uc's are spacing out Pt comfortable w/ epidural VSS Cat 1 when tracing UCs not tracing SVE: 5/90/-1, vtx FSE and IUPC placed w/o difficulty Can start pit per protocol if inadequate mvu's/not continuing to change cervix.  Roma Schanz, CNM, [facility] 01/21/2023 12:36 AM

## 2023-01-21 NOTE — Progress Notes (Signed)
Patient ID: Monique Fox, female   DOB: July 26, 1996, 27 y.o.   MRN: [id number] Monique Fox is a 27 y.o. G1P0000 at [redacted]w[redacted]d admitted for  PPROM/early labor  Subjective: no complaints and comfortable with epidural  Objective: BP 116/68   Pulse 88   Temp 98.2 F (36.8 C) (Oral)   Resp 18   Ht 5\' 3"  (1.6 m)   Wt 119.3 kg   LMP 05/02/2022   SpO2 96%   BMI 46.61 kg/m  No intake/output data recorded.  FHR baseline 135 bpm, Variability: moderate, Accelerations:present, Decelerations:  Absent Toco: q 3-4 mins   SVE:   10/100/+2  Pitocin @ 8 mu/min  Labs: Lab Results  Component Value Date   WBC 11.4 (H) 01/20/2023   HGB 11.6 (L) 01/20/2023   HCT 34.6 (L) 01/20/2023   MCV 86.7 01/20/2023   PLT 281 01/20/2023    Assessment / Plan: Admitted w/ PPROM/early labor, augmented w/ pit, now complete, not feeling any pressure/urge to push, will labor down   Labor: active Fetal Wellbeing:  Category I Pain Control:  epidural Pre-eclampsia: N/A I/D:   PCN for GBS unknown and preterm Anticipated MOD: NSVB  Roma Schanz CNM, [facility] 01/21/2023, 7:25 AM

## 2023-01-21 NOTE — Progress Notes (Signed)
Patient ID: Monique Fox, female   DOB: 03-12-1996, 27 y.o.   MRN: [id number] Monique Fox is a 27 y.o. G1P0000 at [redacted]w[redacted]d admitted for PPROM and early labor  Subjective: no complaints and comfortable with epidural  Objective: BP (!) 113/48   Pulse 89   Temp 97.8 F (36.6 C) (Oral)   Resp 18   Ht 5\' 3"  (1.6 m)   Wt 119.3 kg   LMP 05/02/2022   SpO2 96%   BMI 46.61 kg/m  Total I/O In: -  Out: 425 [Urine:425]  FHR baseline 135 bpm, Variability: moderate, Accelerations:present, Decelerations:  Present  mild variables Toco: q 3-4 mins, MVUs adequate  SVE:   Dilation: 7 Effacement (%): 90 Station: Plus 1 Exam by:: Doree Fudge, CNM  Pitocin @ 8 mu/min  Labs: Lab Results  Component Value Date   WBC 11.4 (H) 01/20/2023   HGB 11.6 (L) 01/20/2023   HCT 34.6 (L) 01/20/2023   MCV 86.7 01/20/2023   PLT 281 01/20/2023    Assessment / Plan: PPROM, augmenting w/ pit, making progress  Labor: active Fetal Wellbeing:  Category II Pain Control:  epidural Pre-eclampsia: N/A I/D:   PCN for GBS unknown and preterm Anticipated MOD: NSVB  Roma Schanz CNM, [facility]NP[facility]BC 01/21/2023, 4:39 AM

## 2023-01-21 NOTE — Discharge Summary (Signed)
Postpartum Discharge Summary    Patient Name: Monique Fox DOB: 30-Sep-1996 MRN: [id number]  Date of admission: 01/20/2023 Delivery date:01/21/2023  Delivering provider: Shelda Pal  Date of discharge: 01/23/2023  Admitting diagnosis: Preterm labor [O60.00] Intrauterine pregnancy: [redacted]w[redacted]d     Secondary diagnosis:  Principal Problem:   Vaginal delivery Active Problems:   Supervision of normal first pregnancy   Morbid obesity with BMI of 50.0-59.9, adult ([place])   Preterm labor  Additional problems: None    Discharge diagnosis: Preterm Pregnancy Delivered                                              Post partum procedures: None Augmentation: AROM and Pitocin Complications: None  Hospital course: Onset of Labor With Vaginal Delivery      27 y.o. yo G1P0000 at [redacted]w[redacted]d was admitted in Latent Labor on 01/20/2023. Labor course was complicated by PPROM.  Membrane Rupture Time/Date: 7:00 AM ,01/20/2023   Delivery Method:Vaginal, Spontaneous  Episiotomy: None  Lacerations:  Vaginal  Patient had an uncomplicated postpartum course.  She is ambulating, tolerating a regular diet, passing flatus, and urinating well. Patient is discharged home in stable condition on 01/23/23.  Newborn Data: Birth date:01/21/2023  Birth time:9:24 AM  Gender:Female  Living status:Living  Apgars:7 ,9  Weight:3250 g   Magnesium Sulfate received: No BMZ received: No Rhophylac:N/A MMR:N/A T-DaP:Given prenatally Flu: Yes Transfusion:No  Physical exam  Vitals:   01/22/23 1521 01/22/23 2003 01/23/23 0021 01/23/23 0852  BP: 116/73 112/70 113/68 119/75  Pulse: 95 84 81 90  Resp: 18 16 18 16   Temp: 98.5 F (36.9 C) 97.7 F (36.5 C) 98.1 F (36.7 C) 97.7 F (36.5 C)  TempSrc: Oral Oral Oral Axillary  SpO2: 99% 99% 99% 100%  Weight:      Height:       General: alert, cooperative, and no distress Lochia: appropriate Uterine Fundus: firm Incision: N/A DVT Evaluation: No evidence of DVT seen on  physical exam. Negative Homan's sign. No cords or calf tenderness. Labs: Lab Results  Component Value Date   WBC 11.4 (H) 01/20/2023   HGB 11.6 (L) 01/20/2023   HCT 34.6 (L) 01/20/2023   MCV 86.7 01/20/2023   PLT 281 01/20/2023      Latest Ref Rng & Units 01/20/2023   10:11 AM  CMP  Glucose 70 - 99 mg/dL 89   BUN 6 - 20 mg/dL <5   Creatinine 0.44 - 1.00 mg/dL 0.56   Sodium 135 - 145 mmol/L 136   Potassium 3.5 - 5.1 mmol/L 4.0   Chloride 98 - 111 mmol/L 108   CO2 22 - 32 mmol/L 19   Calcium 8.9 - 10.3 mg/dL 7.8   Total Protein 6.5 - 8.1 g/dL 6.2   Total Bilirubin 0.3 - 1.2 mg/dL 0.6   Alkaline Phos 38 - 126 U/L 126   AST 15 - 41 U/L 21   ALT 0 - 44 U/L 11    Edinburgh Score:    01/21/2023   11:37 AM  Edinburgh Postnatal Depression Scale Screening Tool  I have been able to laugh and see the funny side of things. 0  I have looked forward with enjoyment to things. 0  I have blamed myself unnecessarily when things went wrong. 1  I have been anxious or worried for no good reason. 2  I have felt  scared or panicky for no good reason. 1  Things have been getting on top of me. 0  I have been so unhappy that I have had difficulty sleeping. 0  I have felt sad or miserable. 0  I have been so unhappy that I have been crying. 0  The thought of harming myself has occurred to me. 0  Edinburgh Postnatal Depression Scale Total 4     After visit meds:  Allergies as of 01/23/2023       Reactions   Pineapple Hives, Itching        Medication List     STOP taking these medications    aspirin EC 81 MG tablet       TAKE these medications    acetaminophen 325 MG tablet Commonly known as: Tylenol Take 2 tablets (650 mg total) by mouth every 4 (four) hours as needed for moderate pain or fever (for pain scale < 4).   albuterol 108 (90 Base) MCG/ACT inhaler Commonly known as: VENTOLIN HFA Inhale 2 puffs into the lungs every 6 (six) hours as needed for wheezing or shortness  of breath.   ibuprofen 600 MG tablet Commonly known as: ADVIL Take 1 tablet (600 mg total) by mouth every 6 (six) hours as needed for mild pain, moderate pain, headache or cramping.   levocetirizine 5 MG tablet Commonly known as: XYZAL Take 5 mg by mouth at bedtime.   PNV Prenatal Plus Multivitamin 27-1 MG Tabs Take 1 tablet by mouth daily.   senna-docusate 8.6-50 MG tablet Commonly known as: Senokot-S Take 2 tablets by mouth 2 (two) times daily as needed for mild constipation or moderate constipation.   Symbicort 80-4.5 MCG/ACT inhaler Generic drug: budesonide-formoterol Inhale 2 puffs into the lungs 2 (two) times daily.        Discharge home in stable condition Infant Feeding: Bottle Infant Disposition:home with mother Discharge instruction: per After Visit Summary and Postpartum booklet. Activity: Advance as tolerated. Pelvic rest for 6 weeks.  Diet: routine diet Future Appointments  Date Time Provider Lydia  03/03/2023 11:30 AM Roma Schanz, CNM [facility]   Follow up Visit: Message sent to [facility] on 3/20 Please schedule this patient for a In person postpartum visit in 6 weeks with the following provider: Any provider. Additional Postpartum F/U: n/a   Low risk pregnancy complicated by:  n/a Delivery mode:  Vaginal, Spontaneous  Anticipated Birth Control:  Condoms   01/23/2023 Verita Schneiders, MD

## 2023-01-21 NOTE — Progress Notes (Signed)
Patient ID: Monique Fox, female   DOB: 1996/03/25, 27 y.o.   MRN: [id number] Monique Fox is a 27 y.o. G1P0000 at [redacted]w[redacted]d admitted for PPROM and early labor  Subjective: no complaints and comfortable with epidural  Objective: BP (!) 101/57   Pulse 82   Temp 98.1 F (36.7 C) (Oral)   Resp 18   Ht 5\' 3"  (1.6 m)   Wt 119.3 kg   LMP 05/02/2022   SpO2 98%   BMI 46.61 kg/m  Total I/O In: -  Out: 425 [Urine:425]  FHR baseline 135 bpm, Variability: moderate, Accelerations:present, Decelerations:  Absent Toco:  not tracing well, toco readjusted by RN    SVE:   Dilation: 4.5 Effacement (%): 90 Station: -2 Exam by:: Lupita Shutter, RN  Labs: Lab Results  Component Value Date   WBC 11.4 (H) 01/20/2023   HGB 11.6 (L) 01/20/2023   HCT 34.6 (L) 01/20/2023   MCV 86.7 01/20/2023   PLT 281 01/20/2023    Assessment / Plan: PPROM, continuing to make change on her own. If labor stalls will augment w/ pit  Labor: early Fetal Wellbeing:  Category I Pain Control:  epidural Pre-eclampsia: N/A I/D:   PCN for GBS unknown and preterm Anticipated MOD: NSVB  Roma Schanz CNM, [facility] 01/21/2023, 12:06 AM

## 2023-01-22 ENCOUNTER — Other Ambulatory Visit: Payer: 59

## 2023-01-22 ENCOUNTER — Encounter: Payer: 59 | Admitting: Advanced Practice Midwife

## 2023-01-22 LAB — BIRTH TISSUE RECOVERY COLLECTION (PLACENTA DONATION)

## 2023-01-22 NOTE — Lactation Note (Signed)
This note was copied from a baby's chart. Lactation Consultation Note  Patient Name: Monique Fox [id number] Date: 01/22/2023 Age:28 hours Reason for consult: Follow-up assessment;Late-preterm 34-36.6wks (weight loss LPTI -0.12%, infant is BF and being supplemented with donor breast milk.)  P1, Per Birth Parent, infant will briefly latch for 2 to 3 minutes then fall asleep, afterwards she is giving infant her pumped EBM first and then donor breast milk. Today on day 2 infant is consuming 25 mls of EBM/donor per feeding. Birth Parent doesn't have any BF questions or concerns for LC, she will continue to follow LPTI feeding guidelines for Day 2 of life. LC did not observe latch due infant recently feeding at 1440 pm.   Maternal Data    Feeding Mother's Current Feeding Choice: Breast Milk and Donor Milk Nipple Type: Extra Slow Flow  LATCH Score  Latch not observe due infant recently BF and being supplement with EBM/donor milk.                 Lactation Tools Discussed/Used Pumping frequency: Per Birth Parent , she is pumping and now expressing 5 mls each pumping session. Pumped volume: 5 mL  Interventions Interventions: Education;Pace feeding;Expressed milk;DEBP  Discharge    Consult Status Consult Status: Follow-up Date: 01/23/23 Follow-up type: In-patient    Eulis Canner 01/22/2023, 4:39 PM

## 2023-01-22 NOTE — Progress Notes (Signed)
CSW received consult for hx of Anxiety.  CSW met with MOB to offer support and complete assessment, FOB present. CSW introduced self and explained role. MOB granted CSW verbal permission to speak in front of FOB about anything. CSW explained reason for consult. MOB was welcoming, pleasant, and remained engaged during assessment. CSW and MOB discussed MOB's mental health history. MOB reported that she was diagnosed with anxiety in 2021. MOB denied any current symptoms. MOB reported that she is not taking any medication nor participating in therapy to treat anxiety. MOB denied any additional mental health history. CSW inquired about how MOB was feeling emotionally since giving birth, MOB reported that she was feeling pretty good. MOB presented calm and did not demonstrate any acute mental health signs/symptoms. CSW assessed for safety, MOB denied SI and HI. CSW did not assess for domestic violence as FOB was present. CSW inquired about MOB's support system, MOB reported that her Husband/FOB and mom are supports.   CSW provided education regarding the baby blues period vs. perinatal mood disorders, discussed treatment and gave resources for mental health follow up if concerns arise.  CSW recommends self-evaluation during the postpartum time period using the New Mom Checklist from Postpartum Progress and encouraged MOB to contact a medical professional if symptoms are noted at any time.    CSW provided review of Sudden Infant Death Syndrome (SIDS) precautions. MOB verbalized understanding and reported having all items needed to care for infant including a car seat, crib, and bassinet.   CSW identifies no further need for intervention and no barriers to discharge at this time.  Abundio Miu, [place] Worker [facility] Cell#: [phone]

## 2023-01-22 NOTE — Progress Notes (Signed)
Post Partum Day 1 Subjective: no complaints, up ad lib, and voiding  Objective: Blood pressure (!) 120/56, pulse 84, temperature 97.8 F (36.6 C), temperature source Oral, resp. rate 18, height 5\' 3"  (1.6 m), weight 119.3 kg, last menstrual period 05/02/2022, SpO2 99 %, unknown if currently breastfeeding.  Physical Exam:  General: alert, cooperative, and appears stated age 27: appropriate Uterine Fundus: firm DVT Evaluation: No evidence of DVT seen on physical exam.  Recent Labs    01/20/23 1011  HGB 11.6*  HCT 34.6*    Assessment/Plan: Plan for discharge tomorrow, Breastfeeding, and Circumcision prior to discharge Deciding about Circumcision in Baby Boys  (The Basics)  What is circumcision?   Circumcision is a surgery that removes the skin that covers the tip of the penis, called the "foreskin" Circumcision is usually done when a boy is between 47 and 76 days old. In the [place], circumcision is common. In some other countries, fewer boys are circumcised. Circumcision is a common tradition in some religions.  Should I have my baby boy circumcised?   There is no easy answer. Circumcision has some benefits. But it also has risks. After talking with your doctor, you will have to decide for yourself what is right for your family.  What are the benefits of circumcision?   Circumcised boys seem to have slightly lower rates of: ?Urinary tract infections ?Swelling of the opening at the tip of the penis Circumcised men seem to have slightly lower rates of: ?Urinary tract infections ?Swelling of the opening at the tip of the penis ?Penis cancer ?HIV and other infections that you catch during sex ?Cervical cancer in the women they have sex with Even so, in the [place], the risks of these problems are small - even in boys and men who have not been circumcised. Plus, boys and men who are not circumcised can reduce these extra risks by: ?Cleaning their penis  well ?Using condoms during sex  What are the risks of circumcision?  Risks include: ?Bleeding or infection from the surgery ?Damage to or amputation of the penis ?A chance that the doctor will cut off too much or not enough of the foreskin ?A chance that sex won't feel as good later in life Only about 1 out of every 200 circumcisions leads to problems. There is also a chance that your health insurance won't pay for circumcision.  How is circumcision done in baby boys?  First, the baby gets medicine for pain relief. This might be a cream on the skin or a shot into the base of the penis. Next, the doctor cleans the baby's penis well. Then he or she uses special tools to cut off the foreskin. Finally, the doctor wraps a bandage (called gauze) around the baby's penis. If you have your baby circumcised, his doctor or nurse will give you instructions on how to care for him after the surgery. It is important that you follow those instructions carefully.   LOS: 2 days   Donnamae Jude, MD 01/22/2023, 7:57 AM

## 2023-01-23 MED ORDER — ACETAMINOPHEN 325 MG PO TABS: 650.0000 mg | ORAL_TABLET | ORAL | 0 refills | Status: DC | PRN

## 2023-01-23 MED ORDER — IBUPROFEN 600 MG PO TABS: 600.0000 mg | ORAL_TABLET | Freq: Four times a day (QID) | ORAL | 3 refills | Status: DC | PRN

## 2023-01-23 MED ORDER — SENNOSIDES-DOCUSATE SODIUM 8.6-50 MG PO TABS: 2.0000 | ORAL_TABLET | Freq: Two times a day (BID) | ORAL | 0 refills | Status: DC | PRN

## 2023-01-23 NOTE — Progress Notes (Signed)
Reviewed discharge instructions with patient and husband together.  Patient to remain in room with baby until baby is discharged.

## 2023-01-23 NOTE — Lactation Note (Signed)
This note was copied from a baby's chart. Lactation Consultation Note  Patient Name: Monique Fox [id number] Date: 01/23/2023 Age:28 hours Reason for consult: Follow-up assessment;Late-preterm 34-36.6wks;Primapara;1st time breastfeeding;Infant weight loss (4 % weight loss. per mom milk is coming in 15 - 30 ml. [place] praised mom for her pumping, reviewed doc flow sheets and updated per mom. Baby recently fed and updated.) LC noted the Bilirubin skin check was elevated this am 10.8.  LC recommended to mom is the baby is sluggish to start a feeding, not to send a large amount of time trying to latch, to go ahead and fed the supplement and then is he is less sluggish latch if not go it a feeding and post pump. LC reviewed the benefits of breast feeding, EBM and donor milk to clean out the gut faster to decrease bilirubin.  For volume for supplementing should be at least 30 ml.  Per mom milk feels like its coming in and the #21 F is still comfortable.  LC reviewed engorgement prevention and tx and may need to increase the flange to #24 F when breast are fuller or engorged.    Maternal Data    Feeding Mother's Current Feeding Choice: Breast Milk and Donor Milk Nipple Type: Extra Slow Flow  LATCH Score- LC unable to check a latch, baby recently fed.    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

## 2023-01-24 ENCOUNTER — Ambulatory Visit (HOSPITAL_COMMUNITY): Payer: Self-pay

## 2023-01-24 NOTE — Lactation Note (Signed)
This note was copied from a baby's chart. Lactation Consultation Note  Patient Name: Monique Fox [id number] Date: 01/24/2023 Age:28 hours Reason for consult: Follow-up assessment;Late-preterm 34-36.6wks;1st time breastfeeding;Primapara;Infant weight loss (Serum Bili elevated , repeat this afternoon, and may be D/C per mom and the [facility] RN . Milk is in. LC reviewed BF D/C teaching and the [facility] resources. LC offered a LC O/P and mom receptive.) Per mom baby has been to the breast some, but gets inpatient due the flow being different. LC recommended trying and appetizer of EBM 1st and then latching.   Maternal Data    Feeding Mother's Current Feeding Choice: Breast Milk Nipple Type: Extra Slow Flow    Lactation Tools Discussed/Used Tools: Pump;Flanges Flange Size: 21;24 Breast pump type: Double-Electric Breast Pump Pump Education: Milk Storage  Interventions  D/C teaching   Discharge Discharge Education: Engorgement and breast care;Warning signs for feeding baby;Outpatient recommendation;Outpatient Epic message sent;Other (comment) (mom aware she will receive a call back from the [facility] O/P) Pump: Personal;DEBP  Consult Status Consult Status: Complete Date: 01/24/23    Myer Haff 01/24/2023, 1:19 PM

## 2023-01-26 ENCOUNTER — Other Ambulatory Visit: Payer: 59

## 2023-01-28 ENCOUNTER — Telehealth (HOSPITAL_COMMUNITY): Payer: Self-pay

## 2023-01-28 NOTE — Telephone Encounter (Signed)
Preadmission testing

## 2023-01-29 ENCOUNTER — Encounter: Payer: 59 | Admitting: Advanced Practice Midwife

## 2023-01-29 ENCOUNTER — Other Ambulatory Visit: Payer: 59

## 2023-01-30 ENCOUNTER — Telehealth (HOSPITAL_COMMUNITY): Payer: Self-pay

## 2023-01-30 NOTE — Telephone Encounter (Signed)
Chart review.

## 2023-02-02 ENCOUNTER — Other Ambulatory Visit: Payer: 59

## 2023-02-03 ENCOUNTER — Telehealth (HOSPITAL_COMMUNITY): Payer: Self-pay

## 2023-02-03 NOTE — Telephone Encounter (Signed)
Patient reports feeling good. Patient declines questions/concerns about her health and healing.  Patient reports that baby is doing well. Baby sleeps in a bassinet. RN reviewed ABC's of safe sleep with patient. Patient declines any questions or concerns about baby.  EPDS score is 4.  [place] [facility] [place]   02/03/23,1957

## 2023-02-05 ENCOUNTER — Other Ambulatory Visit: Payer: 59

## 2023-02-05 ENCOUNTER — Encounter: Payer: 59 | Admitting: Advanced Practice Midwife

## 2023-02-09 ENCOUNTER — Other Ambulatory Visit: Payer: 59

## 2023-02-12 ENCOUNTER — Other Ambulatory Visit: Payer: 59

## 2023-02-12 ENCOUNTER — Encounter: Payer: 59 | Admitting: Advanced Practice Midwife

## 2023-02-24 ENCOUNTER — Other Ambulatory Visit: Payer: Self-pay | Admitting: Women's Health

## 2023-03-03 ENCOUNTER — Encounter: Payer: Self-pay | Admitting: Women's Health

## 2023-03-03 ENCOUNTER — Other Ambulatory Visit (HOSPITAL_COMMUNITY)
Admission: RE | Admit: 2023-03-03 | Discharge: 2023-03-03 | Disposition: A | Discharge status: Home / Self Care | Payer: 59 | Source: Ambulatory Visit | Attending: Women's Health | Admitting: Women's Health

## 2023-03-03 ENCOUNTER — Ambulatory Visit (INDEPENDENT_AMBULATORY_CARE_PROVIDER_SITE_OTHER): Payer: 59 | Admitting: Women's Health

## 2023-03-03 DIAGNOSIS — Z124 Encounter for screening for malignant neoplasm of cervix: Secondary | ICD-10-CM | POA: Insufficient documentation

## 2023-03-03 DIAGNOSIS — Z3009 Encounter for other general counseling and advice on contraception: Secondary | ICD-10-CM | POA: Diagnosis not present

## 2023-03-03 DIAGNOSIS — Z8751 Personal history of pre-term labor: Secondary | ICD-10-CM | POA: Insufficient documentation

## 2023-03-03 NOTE — Addendum Note (Signed)
Addended by: Federico Flake A on: 03/03/2023 11:59 AM   Modules accepted: Orders

## 2023-03-03 NOTE — Progress Notes (Signed)
POSTPARTUM VISIT Patient name: Monique Fox MRN [id number]  Date of birth: Mar 23, 1996 Chief Complaint:   Postpartum Care  History of Present Illness:   Monique Fox is a 27 y.o. G6P0101 Caucasian female being seen today for a postpartum visit. She is 5 weeks postpartum following a spontaneous vaginal delivery at 36.3 gestational weeks after PPROM, augmented w/ pit. IOL: no, for n/a. Anesthesia: epidural.  Laceration: Rt vaginal wall, Lt periurethral.  Complications: none. Inpatient contraception: no.   Pregnancy uncomplicated. Tobacco use: no. Substance use disorder: no. Last pap smear: 2021 and results were negative per pt report at PCP/[facility] . Next pap smear due: now No LMP recorded. (Menstrual status: Lactating).  Postpartum course has been uncomplicated. Bleeding none. Bowel function is normal. Bladder function is normal. Urinary incontinence? no, fecal incontinence? no Patient is not sexually active. Last sexual activity: prior to birth of baby. Desired contraception: Condoms, declines Phexxi. Patient does want a pregnancy in the future.  Desired family size is 2-3 children.   [place] - 03/03/23 1129       Pregnancy Intention Screening   Does the patient want to become pregnant in the next year? Ok Either Way    Does the patient's partner want to become pregnant in the next year? Ok Either Way    Would the patient like to discuss contraceptive options today? No      Contraception Wrap Up   Current Method Abstinence   breast feeding   End Method --   breast feeding           The pregnancy intention screening data noted above was reviewed. Potential methods of contraception were discussed. The patient elected to proceed with -- (breast feeding).  Edinburgh Postpartum Depression Screening: negative  Edinburgh Postnatal Depression Scale - 03/03/23 1128       Edinburgh Postnatal Depression Scale:  In the Past 7 Days   I have been able to laugh and see the funny side of  things. 0    I have looked forward with enjoyment to things. 0    I have blamed myself unnecessarily when things went wrong. 1    I have been anxious or worried for no good reason. 1    I have felt scared or panicky for no good reason. 0    Things have been getting on top of me. 0    I have been so unhappy that I have had difficulty sleeping. 0    I have felt sad or miserable. 0    I have been so unhappy that I have been crying. 0    The thought of harming myself has occurred to me. 0    Edinburgh Postnatal Depression Scale Total 2                08/04/2022    9:28 AM 01/23/2022   10:03 AM  GAD 7 : Generalized Anxiety Score  Nervous, Anxious, on Edge 0 2  Control/stop worrying 0 2  Worry too much - different things 0 2  Trouble relaxing 0 2  Restless 0 1  Easily annoyed or irritable 0 1  Afraid - awful might happen 0 0  Total GAD 7 Score 0 10     Baby's course has been uncomplicated. Baby is feeding by breast: milk supply adequate. Infant has a pediatrician/family doctor? Yes.  Childcare strategy if returning to work/school: family.  Pt has material needs met for her and baby: Yes.   Review of Systems:  Pertinent items are noted in HPI Denies Abnormal vaginal discharge w/ itching/odor/irritation, headaches, visual changes, shortness of breath, chest pain, abdominal pain, severe nausea/vomiting, or problems with urination or bowel movements. Pertinent History Reviewed:  Reviewed past medical,surgical, obstetrical and family history.  Reviewed problem list, medications and allergies. OB History  Gravida Para Term Preterm AB Living  1 1 0 1 0 1  SAB IAB Ectopic Multiple Live Births  0 0 0 0 1    # Outcome Date GA Lbr Len/2nd Weight Sex Delivery Anes PTL Lv  1 Preterm 01/21/23 [redacted]w[redacted]d 04:21 / 02:03 7 lb 2.6 oz (3.25 kg) M Vag-Spont EPI  LIV   Physical Assessment:   Vitals:   03/03/23 1121  BP: (!) 118/49  Pulse: 98  Weight: 238 lb (108 kg)  Height: 5\' 3"  (1.6 m)   Body mass index is 42.16 kg/m.       Physical Examination:   General appearance: alert, well appearing, and in no distress  Mental status: alert, oriented to person, place, and time  Skin: warm & dry   Cardiovascular: normal heart rate noted   Respiratory: normal respiratory effort, no distress   Breasts: deferred, no complaints   Abdomen: soft, non-tender   Pelvic: lacs well healed. Thin prep pap obtained: Yes  Rectal: not examined  Extremities: Edema: none   Chaperone: Peggy Dones         No results found for this or any previous visit (from the past 24 hour(s)).  Assessment & Plan:  1) Postpartum exam 2) 5 wks s/p spontaneous vaginal delivery @ 36.3wks d/t PPROM>labor 3) breast feeding 4) Depression screening 5) Contraception counseling>condoms, declines Phexxi 6) Screening for cervical cancer> pap today  Essential components of care per ACOG recommendations:  1.  Mood and well being:  If positive depression screen, discussed and plan developed.  If using tobacco we discussed reduction/cessation and risk of relapse If current substance abuse, we discussed and referral to local resources was offered.   2. Infant care and feeding:  If breastfeeding, discussed returning to work, pumping, breastfeeding-associated pain, guidance regarding return to fertility while lactating if not using another method. If needed, patient was provided with a letter to be allowed to pump q 2-3hrs to support lactation in a private location with access to a refrigerator to store breastmilk.   Recommended that all caregivers be immunized for flu, pertussis and other preventable communicable diseases If pt does not have material needs met for her/baby, referred to local resources for help obtaining these.  3. Sexuality, contraception and birth spacing Provided guidance regarding sexuality, management of dyspareunia, and resumption of intercourse Discussed avoiding interpregnancy interval <19mths and  recommended birth spacing of 18 months  4. Sleep and fatigue Discussed coping options for fatigue and sleep disruption Encouraged family/partner/community support of 4 hrs of uninterrupted sleep to help with mood and fatigue  5. Physical recovery  If pt had a C/S, assessed incisional pain and providing guidance on normal vs prolonged recovery If pt had a laceration, perineal healing and pain reviewed.  If urinary or fecal incontinence, discussed management and referred to PT or uro/gyn if indicated  Patient is safe to resume physical activity. Discussed attainment of healthy weight.  6.  Chronic disease management Discussed pregnancy complications if any, and their implications for future childbearing and long-term maternal health. Review recommendations for prevention of recurrent pregnancy complications yes, or aspirin to reduce risk of preeclampsia not applicable. Pt had GDM: no. If yes, 2hr GTT  scheduled: not applicable. Reviewed medications and non-pregnant dosing including consideration of whether pt is breastfeeding using a reliable resource such as LactMed: not applicable Referred for f/u w/ PCP or subspecialist providers as indicated: not applicable  7. Health maintenance Mammogram at 28yo or earlier if indicated Pap smears as indicated  Meds: No orders of the defined types were placed in this encounter.   Follow-up: Return in about 1 year (around 03/02/2024) for Physical.   No orders of the defined types were placed in this encounter.   Cheral Marker CNM, [facility] 03/03/2023 11:51 AM

## 2023-03-04 LAB — CYTOLOGY - PAP
Comment: NEGATIVE
Diagnosis: NEGATIVE
High risk HPV: NEGATIVE

## 2023-06-12 DIAGNOSIS — Z139 Encounter for screening, unspecified: Secondary | ICD-10-CM | POA: Diagnosis not present

## 2023-11-01 ENCOUNTER — Other Ambulatory Visit: Payer: Self-pay | Admitting: Women's Health

## 2023-12-28 ENCOUNTER — Encounter (HOSPITAL_COMMUNITY): Payer: Self-pay

## 2023-12-28 ENCOUNTER — Other Ambulatory Visit (HOSPITAL_COMMUNITY): Payer: Self-pay

## 2023-12-28 ENCOUNTER — Other Ambulatory Visit: Payer: Self-pay

## 2023-12-28 MED ORDER — BUDESONIDE-FORMOTEROL FUMARATE 80-4.5 MCG/ACT IN AERO
2.0000 | INHALATION_SPRAY | Freq: Two times a day (BID) | RESPIRATORY_TRACT | 3 refills | Status: AC
  Filled 2023-12-28 (×2): qty 10.2, 30d supply, fill #0

## 2023-12-28 MED ORDER — ZEPBOUND 2.5 MG/0.5ML ~~LOC~~ SOAJ
2.5000 mg | SUBCUTANEOUS | 1 refills | Status: DC
  Filled 2023-12-28 (×4): qty 2, 28d supply, fill #0

## 2023-12-28 MED ORDER — ALBUTEROL SULFATE HFA 108 (90 BASE) MCG/ACT IN AERS
2.0000 | INHALATION_SPRAY | RESPIRATORY_TRACT | 1 refills | Status: DC | PRN
  Filled 2023-12-28 (×2): qty 6.7, 17d supply, fill #0

## 2023-12-29 ENCOUNTER — Other Ambulatory Visit: Payer: Self-pay

## 2024-01-01 ENCOUNTER — Other Ambulatory Visit: Payer: Self-pay

## 2024-01-11 ENCOUNTER — Other Ambulatory Visit (HOSPITAL_COMMUNITY): Payer: Self-pay

## 2024-02-24 ENCOUNTER — Telehealth: Admitting: Physician Assistant

## 2024-02-24 ENCOUNTER — Other Ambulatory Visit (HOSPITAL_COMMUNITY): Payer: Self-pay

## 2024-02-24 DIAGNOSIS — J019 Acute sinusitis, unspecified: Secondary | ICD-10-CM | POA: Diagnosis not present

## 2024-02-24 DIAGNOSIS — B9689 Other specified bacterial agents as the cause of diseases classified elsewhere: Secondary | ICD-10-CM

## 2024-02-24 MED ORDER — AMOXICILLIN-POT CLAVULANATE 875-125 MG PO TABS
1.0000 | ORAL_TABLET | Freq: Two times a day (BID) | ORAL | 0 refills | Status: DC
  Filled 2024-02-24: qty 14, 7d supply, fill #0

## 2024-02-24 MED ORDER — ALBUTEROL SULFATE HFA 108 (90 BASE) MCG/ACT IN AERS
2.0000 | INHALATION_SPRAY | Freq: Four times a day (QID) | RESPIRATORY_TRACT | 0 refills | Status: DC | PRN
  Filled 2024-02-24: qty 6.7, 25d supply, fill #0

## 2024-02-24 NOTE — Progress Notes (Signed)
 E-Visit for Sinus Problems  We are sorry that you are not feeling well.  Here is how we plan to help!  Based on what you have shared with me it looks like you have sinusitis.  Sinusitis is inflammation and infection in the sinus cavities of the head.  Based on your presentation I believe you most likely have Acute Bacterial Sinusitis.  This is an infection caused by bacteria and is treated with antibiotics. I have prescribed Augmentin  875mg /125mg  one tablet twice daily with food, for 7 days. You may use an oral decongestant such as Mucinex D or if you have glaucoma or high blood pressure use plain Mucinex. Saline nasal spray help and can safely be used as often as needed for congestion.    I have also sent in a refill of your albuterol  inhaler.   If you develop worsening sinus pain, fever or notice severe headache and vision changes, or if symptoms are not better after completion of antibiotic, please schedule an appointment with a health care provider.    Sinus infections are not as easily transmitted as other respiratory infection, however we still recommend that you avoid close contact with loved ones, especially the very young and elderly.  Remember to wash your hands thoroughly throughout the day as this is the number one way to prevent the spread of infection!  Home Care: Only take medications as instructed by your medical team. Complete the entire course of an antibiotic. Do not take these medications with alcohol. A steam or ultrasonic humidifier can help congestion.  You can place a towel over your head and breathe in the steam from hot water coming from a faucet. Avoid close contacts especially the very young and the elderly. Cover your mouth when you cough or sneeze. Always remember to wash your hands.  Get Help Right Away If: You develop worsening fever or sinus pain. You develop a severe head ache or visual changes. Your symptoms persist after you have completed your treatment  plan.  Make sure you Understand these instructions. Will watch your condition. Will get help right away if you are not doing well or get worse.  Thank you for choosing an e-visit.  Your e-visit answers were reviewed by a board certified advanced clinical practitioner to complete your personal care plan. Depending upon the condition, your plan could have included both over the counter or prescription medications.  Please review your pharmacy choice. Make sure the pharmacy is open so you can pick up prescription now. If there is a problem, you may contact your provider through [employer] and have the prescription routed to another pharmacy.  Your safety is important to us . If you have drug allergies check your prescription carefully.   For the next 24 hours you can use MyChart to ask questions about today's visit, request a non-urgent call back, or ask for a work or school excuse. You will get an email in the next two days asking about your experience. I hope that your e-visit has been valuable and will speed your recovery.

## 2024-02-24 NOTE — Progress Notes (Signed)
 I have spent 5 minutes in review of e-visit questionnaire, review and updating patient chart, medical decision making and response to patient.   Piedad Climes, PA-C

## 2024-03-12 ENCOUNTER — Telehealth: Admitting: Physician Assistant

## 2024-03-12 ENCOUNTER — Encounter

## 2024-03-12 DIAGNOSIS — J019 Acute sinusitis, unspecified: Secondary | ICD-10-CM | POA: Diagnosis not present

## 2024-03-12 DIAGNOSIS — B9689 Other specified bacterial agents as the cause of diseases classified elsewhere: Secondary | ICD-10-CM | POA: Diagnosis not present

## 2024-03-12 MED ORDER — AMOXICILLIN-POT CLAVULANATE 875-125 MG PO TABS: 1.0000 | ORAL_TABLET | Freq: Two times a day (BID) | ORAL | 0 refills | Status: DC

## 2024-03-12 NOTE — Progress Notes (Signed)
 E-Visit for Sinus Problems  We are sorry that you are not feeling well.  Here is how we plan to help!  We can repeat the antibiotic at this time.  If no improvement after this course please follow up in person at Urgent Care or with your primary care physician.  Please make sure you are using Flonase and Mucinex and drinking lots of fluids.   Based on what you have shared with me it looks like you have sinusitis.  Sinusitis is inflammation and infection in the sinus cavities of the head.  Based on your presentation I believe you most likely have Acute Bacterial Sinusitis.  This is an infection caused by bacteria and is treated with antibiotics. I have prescribed Augmentin  875mg /125mg  one tablet twice daily with food, for 7 days. You may use an oral decongestant such as Mucinex D or if you have glaucoma or high blood pressure use plain Mucinex. Saline nasal spray help and can safely be used as often as needed for congestion.  If you develop worsening sinus pain, fever or notice severe headache and vision changes, or if symptoms are not better after completion of antibiotic, please schedule an appointment with a health care provider.    Sinus infections are not as easily transmitted as other respiratory infection, however we still recommend that you avoid close contact with loved ones, especially the very young and elderly.  Remember to wash your hands thoroughly throughout the day as this is the number one way to prevent the spread of infection!  Home Care: Only take medications as instructed by your medical team. Complete the entire course of an antibiotic. Do not take these medications with alcohol. A steam or ultrasonic humidifier can help congestion.  You can place a towel over your head and breathe in the steam from hot water coming from a faucet. Avoid close contacts especially the very young and the elderly. Cover your mouth when you cough or sneeze. Always remember to wash your hands.  Get  Help Right Away If: You develop worsening fever or sinus pain. You develop a severe head ache or visual changes. Your symptoms persist after you have completed your treatment plan.  Make sure you Understand these instructions. Will watch your condition. Will get help right away if you are not doing well or get worse.  Thank you for choosing an e-visit.  Your e-visit answers were reviewed by a board certified advanced clinical practitioner to complete your personal care plan. Depending upon the condition, your plan could have included both over the counter or prescription medications.  Please review your pharmacy choice. Make sure the pharmacy is open so you can pick up prescription now. If there is a problem, you may contact your provider through [employer] and have the prescription routed to another pharmacy.  Your safety is important to us . If you have drug allergies check your prescription carefully.   For the next 24 hours you can use MyChart to ask questions about today's visit, request a non-urgent call back, or ask for a work or school excuse. You will get an email in the next two days asking about your experience. I hope that your e-visit has been valuable and will speed your recovery.  I have spent 5 minutes in review of e-visit questionnaire, review and updating patient chart, medical decision making and response to patient.   Char Common Ward, PA-C

## 2024-05-03 ENCOUNTER — Ambulatory Visit: Admitting: *Deleted

## 2024-05-03 ENCOUNTER — Other Ambulatory Visit: Payer: Self-pay | Admitting: Adult Health

## 2024-05-03 VITALS — BP 128/80 | HR 90

## 2024-05-03 DIAGNOSIS — Z3201 Encounter for pregnancy test, result positive: Secondary | ICD-10-CM | POA: Diagnosis not present

## 2024-05-03 LAB — POCT URINE PREGNANCY: Preg Test, Ur: POSITIVE — AB

## 2024-05-03 MED ORDER — ONDANSETRON HCL 4 MG PO TABS: 4.0000 mg | ORAL_TABLET | Freq: Three times a day (TID) | ORAL | 1 refills | Status: AC | PRN

## 2024-05-03 NOTE — Progress Notes (Signed)
 Rx zofran

## 2024-05-03 NOTE — Progress Notes (Signed)
   NURSE VISIT- PREGNANCY CONFIRMATION   SUBJECTIVE:  Monique Fox is a 28 y.o. G35P0101 female at [redacted]w[redacted]d by certain LMP of Patient's last menstrual period was 03/18/2024. Here for pregnancy confirmation.  Home pregnancy test: positive x 6  She reports no complaints.  She is taking prenatal vitamins.    OBJECTIVE:  LMP 03/18/2024   Breastfeeding No   Appears well, in no apparent distress  No results found for this or any previous visit (from the past 24 hours).  ASSESSMENT: Positive pregnancy test, [redacted]w[redacted]d by LMP    PLAN: Schedule for dating ultrasound in 2 weeks Prenatal vitamins: continue   Nausea medicines: requested-note routed to Delon Lewis to send prescription   OB packet given: Yes  Alan LITTIE Fischer  05/03/2024 8:40 AM

## 2024-05-13 ENCOUNTER — Other Ambulatory Visit (INDEPENDENT_AMBULATORY_CARE_PROVIDER_SITE_OTHER): Payer: Self-pay

## 2024-05-13 ENCOUNTER — Ambulatory Visit (INDEPENDENT_AMBULATORY_CARE_PROVIDER_SITE_OTHER): Admitting: *Deleted

## 2024-05-13 VITALS — BP 125/84 | HR 84

## 2024-05-13 DIAGNOSIS — Z3A01 Less than 8 weeks gestation of pregnancy: Secondary | ICD-10-CM

## 2024-05-13 DIAGNOSIS — O3680X0 Pregnancy with inconclusive fetal viability, not applicable or unspecified: Secondary | ICD-10-CM

## 2024-05-13 DIAGNOSIS — Z3481 Encounter for supervision of other normal pregnancy, first trimester: Secondary | ICD-10-CM

## 2024-05-13 NOTE — Progress Notes (Signed)
 Dating Scan  I explained I am completing a dating/viability scan today. Pt receives care at [facility]. We discussed EDD of 01/03/2025, by Ultrasound. Pt is G2P0101. I reviewed her allergies, medications and Medical/Surgical/OB history.    Patient Active Problem List   Diagnosis Date Noted   History of preterm delivery 03/03/2023   Fibroid 09/30/2022   Anxiety 06/09/2022    Concerns addressed today  Patient informed that the ultrasound is considered a limited obstetric ultrasound and is not intended to be a complete ultrasound exam.  Patient also informed that the ultrasound is not being completed with the intent of assessing for fetal or placental anomalies or any pelvic abnormalities. Explained that the purpose of today's ultrasound is to assess for viability.  Patient acknowledges the purpose of the exam and the limitations of the study.     Wanda Buckles, RN

## 2024-05-30 ENCOUNTER — Encounter: Payer: Self-pay | Admitting: Women's Health

## 2024-05-31 NOTE — Telephone Encounter (Signed)
-   Called patient to review her exposure - She notes that she was in a room with the patient who tested postive for CMV.  However she had no exposure or contact to bodily fluids, etc -Reassured patient that risk was low and reviewed good hand hygiene - Questions and concerns were addressed

## 2024-06-10 DIAGNOSIS — Z3490 Encounter for supervision of normal pregnancy, unspecified, unspecified trimester: Secondary | ICD-10-CM | POA: Insufficient documentation

## 2024-06-10 DIAGNOSIS — O0990 Supervision of high risk pregnancy, unspecified, unspecified trimester: Secondary | ICD-10-CM | POA: Insufficient documentation

## 2024-06-21 ENCOUNTER — Encounter: Payer: Self-pay | Admitting: Women's Health

## 2024-06-21 ENCOUNTER — Other Ambulatory Visit (HOSPITAL_COMMUNITY)
Admission: RE | Admit: 2024-06-21 | Discharge: 2024-06-21 | Disposition: A | Discharge status: Home / Self Care | Source: Ambulatory Visit | Attending: Women's Health | Admitting: Women's Health

## 2024-06-21 ENCOUNTER — Ambulatory Visit (INDEPENDENT_AMBULATORY_CARE_PROVIDER_SITE_OTHER): Admitting: Women's Health

## 2024-06-21 ENCOUNTER — Encounter: Admitting: *Deleted

## 2024-06-21 VITALS — BP 112/76 | HR 73 | Wt 266.4 lb

## 2024-06-21 DIAGNOSIS — Z3A12 12 weeks gestation of pregnancy: Secondary | ICD-10-CM | POA: Diagnosis not present

## 2024-06-21 DIAGNOSIS — F419 Anxiety disorder, unspecified: Secondary | ICD-10-CM

## 2024-06-21 DIAGNOSIS — O0991 Supervision of high risk pregnancy, unspecified, first trimester: Secondary | ICD-10-CM | POA: Diagnosis not present

## 2024-06-21 DIAGNOSIS — Z131 Encounter for screening for diabetes mellitus: Secondary | ICD-10-CM | POA: Diagnosis not present

## 2024-06-21 DIAGNOSIS — Z6841 Body Mass Index (BMI) 40.0 and over, adult: Secondary | ICD-10-CM | POA: Diagnosis not present

## 2024-06-21 DIAGNOSIS — Z3480 Encounter for supervision of other normal pregnancy, unspecified trimester: Secondary | ICD-10-CM

## 2024-06-21 DIAGNOSIS — O0990 Supervision of high risk pregnancy, unspecified, unspecified trimester: Secondary | ICD-10-CM

## 2024-06-21 DIAGNOSIS — Z3481 Encounter for supervision of other normal pregnancy, first trimester: Secondary | ICD-10-CM | POA: Diagnosis not present

## 2024-06-21 DIAGNOSIS — J45909 Unspecified asthma, uncomplicated: Secondary | ICD-10-CM | POA: Insufficient documentation

## 2024-06-21 NOTE — Patient Instructions (Signed)
 Monique Fox, thank you for choosing our office today! We appreciate the opportunity to meet your healthcare needs. You may receive a short survey by mail, e-mail, or through [employer]. If you are happy with your care we would appreciate if you could take just a few minutes to complete the survey questions. We read all of your comments and take your feedback very seriously. Thank you again for choosing our office.  Center for [employer] [facility] Team at [facility]  [facility] at [facility] ([address] [place], [place] [address]) Entrance C, located off of [address] Free 24/7 valet parking   Nausea & Vomiting Have saltine crackers or pretzels by your bed and eat a few bites before you raise your head out of bed in the morning Eat small frequent meals throughout the day instead of large meals Drink plenty of fluids throughout the day to stay hydrated, just don't drink a lot of fluids with your meals.  This can make your stomach fill up faster making you feel sick Do not brush your teeth right after you eat Products with real ginger are good for nausea, like ginger ale and ginger hard candy Make sure it says made with real ginger! Sucking on sour candy like lemon heads is also good for nausea If your prenatal vitamins make you nauseated, take them at night so you will sleep through the nausea Sea Bands If you feel like you need medicine for the nausea & vomiting please let us know If you are unable to keep any fluids or food down please let us know   Constipation Drink plenty of fluid, preferably water, throughout the day Eat foods high in fiber such as fruits, vegetables, and grains Exercise, such as walking, is a good way to keep your bowels regular Drink warm fluids, especially warm prune juice, or decaf coffee Eat a 1/2 cup of real oatmeal (not instant), 1/2 cup applesauce, and 1/2-1 cup warm prune juice every day If needed, you may take Colace (docusate sodium) stool softener  once or twice a day to help keep the stool soft.  If you still are having problems with constipation, you may take Miralax once daily as needed to help keep your bowels regular.   Home Blood Pressure Monitoring for Patients   Your provider has recommended that you check your blood pressure (BP) at least once a week at home. If you do not have a blood pressure cuff at home, one will be provided for you. Contact your provider if you have not received your monitor within 1 week.   Helpful Tips for Accurate Home Blood Pressure Checks  Don't smoke, exercise, or drink caffeine 30 minutes before checking your BP Use the restroom before checking your BP (a full bladder can raise your pressure) Relax in a comfortable upright chair Feet on the ground Left arm resting comfortably on a flat surface at the level of your heart Legs uncrossed Back supported Sit quietly and don't talk Place the cuff on your bare arm Adjust snuggly, so that only two fingertips can fit between your skin and the top of the cuff Check 2 readings separated by at least one minute Keep a log of your BP readings For a visual, please reference this diagram: http://ccnc.care/bpdiagram  Provider Name: [facility]     Phone: [phone]  Zone 1: ALL CLEAR  Continue to monitor your symptoms:  BP reading is less than 140 (top number) or less than 90 (bottom  number)  No right upper stomach pain No headaches or seeing spots No feeling nauseated or throwing up No swelling in face and hands  Zone 2: CAUTION Call your doctor's office for any of the following:  BP reading is greater than 140 (top number) or greater than 90 (bottom number)  Stomach pain under your ribs in the middle or right side Headaches or seeing spots Feeling nauseated or throwing up Swelling in face and hands  Zone 3: EMERGENCY  Seek immediate medical care if you have any of the following:  BP reading is greater than160 (top number) or greater than  110 (bottom number) Severe headaches not improving with Tylenol Serious difficulty catching your breath Any worsening symptoms from Zone 2    First Trimester of Pregnancy The first trimester of pregnancy is from week 1 until the end of week 12 (months 1 through 3). A week after a sperm fertilizes an egg, the egg will implant on the wall of the uterus. This embryo will begin to develop into a baby. Genes from you and your partner are forming the baby. The female genes determine whether the baby is a boy or a girl. At 6-8 weeks, the eyes and face are formed, and the heartbeat can be seen on ultrasound. At the end of 12 weeks, all the baby's organs are formed.  Now that you are pregnant, you will want to do everything you can to have a healthy baby. Two of the most important things are to get good prenatal care and to follow your health care provider's instructions. Prenatal care is all the medical care you receive before the baby's birth. This care will help prevent, find, and treat any problems during the pregnancy and childbirth. BODY CHANGES Your body goes through many changes during pregnancy. The changes vary from woman to woman.  You may gain or lose a couple of pounds at first. You may feel sick to your stomach (nauseous) and throw up (vomit). If the vomiting is uncontrollable, call your health care provider. You may tire easily. You may develop headaches that can be relieved by medicines approved by your health care provider. You may urinate more often. Painful urination may mean you have a bladder infection. You may develop heartburn as a result of your pregnancy. You may develop constipation because certain hormones are causing the muscles that push waste through your intestines to slow down. You may develop hemorrhoids or swollen, bulging veins (varicose veins). Your breasts may begin to grow larger and become tender. Your nipples may stick out more, and the tissue that surrounds them  (areola) may become darker. Your gums may bleed and may be sensitive to brushing and flossing. Dark spots or blotches (chloasma, mask of pregnancy) may develop on your face. This will likely fade after the baby is born. Your menstrual periods will stop. You may have a loss of appetite. You may develop cravings for certain kinds of food. You may have changes in your emotions from day to day, such as being excited to be pregnant or being concerned that something may go wrong with the pregnancy and baby. You may have more vivid and strange dreams. You may have changes in your hair. These can include thickening of your hair, rapid growth, and changes in texture. Some women also have hair loss during or after pregnancy, or hair that feels dry or thin. Your hair will most likely return to normal after your baby is born. WHAT TO EXPECT AT YOUR PRENATAL  VISITS During a routine prenatal visit: You will be weighed to make sure you and the baby are growing normally. Your blood pressure will be taken. Your abdomen will be measured to track your baby's growth. The fetal heartbeat will be listened to starting around week 10 or 12 of your pregnancy. Test results from any previous visits will be discussed. Your health care provider may ask you: How you are feeling. If you are feeling the baby move. If you have had any abnormal symptoms, such as leaking fluid, bleeding, severe headaches, or abdominal cramping. If you have any questions. Other tests that may be performed during your first trimester include: Blood tests to find your blood type and to check for the presence of any previous infections. They will also be used to check for low iron levels (anemia) and Rh antibodies. Later in the pregnancy, blood tests for diabetes will be done along with other tests if problems develop. Urine tests to check for infections, diabetes, or protein in the urine. An ultrasound to confirm the proper growth and development  of the baby. An amniocentesis to check for possible genetic problems. Fetal screens for spina bifida and Down syndrome. You may need other tests to make sure you and the baby are doing well. HOME CARE INSTRUCTIONS  Medicines Follow your health care provider's instructions regarding medicine use. Specific medicines may be either safe or unsafe to take during pregnancy. Take your prenatal vitamins as directed. If you develop constipation, try taking a stool softener if your health care provider approves. Diet Eat regular, well-balanced meals. Choose a variety of foods, such as meat or vegetable-based protein, fish, milk and low-fat dairy products, vegetables, fruits, and whole grain breads and cereals. Your health care provider will help you determine the amount of weight gain that is right for you. Avoid raw meat and uncooked cheese. These carry germs that can cause birth defects in the baby. Eating four or five small meals rather than three large meals a day may help relieve nausea and vomiting. If you start to feel nauseous, eating a few soda crackers can be helpful. Drinking liquids between meals instead of during meals also seems to help nausea and vomiting. If you develop constipation, eat more high-fiber foods, such as fresh vegetables or fruit and whole grains. Drink enough fluids to keep your urine clear or pale yellow. Activity and Exercise Exercise only as directed by your health care provider. Exercising will help you: Control your weight. Stay in shape. Be prepared for labor and delivery. Experiencing pain or cramping in the lower abdomen or low back is a good sign that you should stop exercising. Check with your health care provider before continuing normal exercises. Try to avoid standing for long periods of time. Move your legs often if you must stand in one place for a long time. Avoid heavy lifting. Wear low-heeled shoes, and practice good posture. You may continue to have sex  unless your health care provider directs you otherwise. Relief of Pain or Discomfort Wear a good support bra for breast tenderness.   Take warm sitz baths to soothe any pain or discomfort caused by hemorrhoids. Use hemorrhoid cream if your health care provider approves.   Rest with your legs elevated if you have leg cramps or low back pain. If you develop varicose veins in your legs, wear support hose. Elevate your feet for 15 minutes, 3-4 times a day. Limit salt in your diet. Prenatal Care Schedule your prenatal visits by the  twelfth week of pregnancy. They are usually scheduled monthly at first, then more often in the last 2 months before delivery. Write down your questions. Take them to your prenatal visits. Keep all your prenatal visits as directed by your health care provider. Safety Wear your seat belt at all times when driving. Make a list of emergency phone numbers, including numbers for family, friends, the hospital, and police and fire departments. General Tips Ask your health care provider for a referral to a local prenatal education class. Begin classes no later than at the beginning of month 6 of your pregnancy. Ask for help if you have counseling or nutritional needs during pregnancy. Your health care provider can offer advice or refer you to specialists for help with various needs. Do not use hot tubs, steam rooms, or saunas. Do not douche or use tampons or scented sanitary pads. Do not cross your legs for long periods of time. Avoid cat litter boxes and soil used by cats. These carry germs that can cause birth defects in the baby and possibly loss of the fetus by miscarriage or stillbirth. Avoid all smoking, herbs, alcohol, and medicines not prescribed by your health care provider. Chemicals in these affect the formation and growth of the baby. Schedule a dentist appointment. At home, brush your teeth with a soft toothbrush and be gentle when you floss. SEEK MEDICAL CARE IF:   You have dizziness. You have mild pelvic cramps, pelvic pressure, or nagging pain in the abdominal area. You have persistent nausea, vomiting, or diarrhea. You have a bad smelling vaginal discharge. You have pain with urination. You notice increased swelling in your face, hands, legs, or ankles. SEEK IMMEDIATE MEDICAL CARE IF:  You have a fever. You are leaking fluid from your vagina. You have spotting or bleeding from your vagina. You have severe abdominal cramping or pain. You have rapid weight gain or loss. You vomit blood or material that looks like coffee grounds. You are exposed to [place] measles and have never had them. You are exposed to fifth disease or chickenpox. You develop a severe headache. You have shortness of breath. You have any kind of trauma, such as from a fall or a car accident. Document Released: 10/14/2001 Document Revised: 03/06/2014 Document Reviewed: 08/30/2013 [facility] Patient Information 2015 [place], [place]. This information is not intended to replace advice given to you by your health care provider. Make sure you discuss any questions you have with your health care provider.

## 2024-06-21 NOTE — Progress Notes (Signed)
 INITIAL OBSTETRICAL VISIT Patient name: Monique Fox MRN [id number]  Date of birth: [birth year] Chief Complaint:   Initial Prenatal Visit  History of Present Illness:   Monique Fox is a 28 y.o. G4P0101 Caucasian female at [redacted]w[redacted]d by US  at 6 weeks with an Estimated Date of Delivery: 01/03/25 being seen today for her initial obstetrical visit.   Patient's last menstrual period was 03/18/2024. Her obstetrical history is significant for 36.3wk PTB d/t PPROM/labor.   Today she reports no complaints.  Last pap 03/03/23. Results were: NILM w/ HRHPV negative     06/21/2024    9:23 AM 08/04/2022    9:27 AM 01/23/2022   10:02 AM 02/24/2017    9:08 AM  Depression screen PHQ 2/9  Decreased Interest 0 0 0 0  Down, Depressed, Hopeless 0 0 0 0  PHQ - 2 Score 0 0 0 0  Altered sleeping 0 0 1   Tired, decreased energy 1 1 1    Change in appetite 0 0 1   Feeling bad or failure about yourself  0 0 1   Trouble concentrating 0 0 0   Moving slowly or fidgety/restless 0 0 0   Suicidal thoughts 0 0 0   PHQ-9 Score 1 1 4          06/21/2024    9:23 AM 08/04/2022    9:28 AM 01/23/2022   10:03 AM  GAD 7 : Generalized Anxiety Score  Nervous, Anxious, on Edge 0 0 2  Control/stop worrying 0 0 2  Worry too much - different things 0 0 2  Trouble relaxing 0 0 2  Restless 0 0 1  Easily annoyed or irritable 0 0 1  Afraid - awful might happen 0 0 0  Total GAD 7 Score 0 0 10     Review of Systems:   Pertinent items are noted in HPI Denies cramping/contractions, leakage of fluid, vaginal bleeding, abnormal vaginal discharge w/ itching/odor/irritation, headaches, visual changes, shortness of breath, chest pain, abdominal pain, severe nausea/vomiting, or problems with urination or bowel movements unless otherwise stated above.  Pertinent History Reviewed:  Reviewed past medical,surgical, social, obstetrical and family history.  Reviewed problem list, medications and allergies. OB History  Gravida Para Term  Preterm AB Living  2 1 0 1 0 1  SAB IAB Ectopic Multiple Live Births  0 0 0 0 1    # Outcome Date GA Lbr Len/2nd Weight Sex Type Anes PTL Lv  2 Current           1 Preterm 01/21/23 [redacted]w[redacted]d 04:21 / 02:03 7 lb 2.6 oz (3.25 kg) M Vag-Spont EPI  LIV   Physical Assessment:   Vitals:   06/21/24 0901  BP: 112/76  Pulse: 73  Weight: 266 lb 6.4 oz (120.8 kg)  Body mass index is 47.19 kg/m.       Physical Examination:  General appearance - well appearing, and in no distress  Mental status - alert, oriented to person, place, and time  Psych:  She has a normal mood and affect  Skin - warm and dry, normal color, no suspicious lesions noted  Chest - effort normal, all lung fields clear to auscultation bilaterally  Heart - normal rate and regular rhythm  Abdomen - soft, nontender  Extremities:  No swelling or varicosities noted  Thin prep pap is not done   Chaperone: N/A  TODAY'S Informal TA u/s: +FCA and active fetus  No results found for this or any previous visit (  from the past 24 hours).  Assessment & Plan:  1) High-Risk Pregnancy G2P0101 at [redacted]w[redacted]d with an Estimated Date of Delivery: 01/03/25   2) Initial OB visit  3) PGBMI 47  4) H/O 36.3wk PTB d/t PPROM/labor> declines prometrium  Meds: No orders of the defined types were placed in this encounter.   Initial labs obtained Continue prenatal vitamins Reviewed n/v relief measures and warning s/s to report Reviewed recommended weight gain based on pre-gravid BMI Encouraged well-balanced diet Genetic & carrier screening discussed: requests Panorama and AFP, Horizon neg prev preg Ultrasound discussed; fetal survey: requested CCNC completed> form faxed if has or is planning to apply for medicaid The nature of [facility] [employer] with multiple MDs and other Advanced Practice Providers was explained to patient; also emphasized that fellows, residents, and students are part of our team. Does have home bp cuff.  Office bp cuff given: no. Rx sent: no. Check bp weekly, let us  know if consistently >140/90.   Follow-up: Return in about 4 weeks (around 07/19/2024) for HROB, AFP, CNM, in person; then 20wks for anatomy u/s and HROB w/ CNM.   Orders Placed This Encounter  Procedures   Urine Culture   CBC/D/Plt+RPR+Rh+ABO+RubIgG...   Hemoglobin A1c   PANORAMA PRENATAL TEST    Monique Fox CNM, [facility] 06/21/2024 9:48 AM

## 2024-06-22 LAB — CERVICOVAGINAL ANCILLARY ONLY
Chlamydia: NEGATIVE
Comment: NEGATIVE
Comment: NORMAL
Neisseria Gonorrhea: NEGATIVE

## 2024-06-23 LAB — CBC/D/PLT+RPR+RH+ABO+RUBIGG...
Antibody Screen: NEGATIVE
Basophils Absolute: 0.1 x10E3/uL (ref 0.0–0.2)
Basos: 1 %
EOS (ABSOLUTE): 0.2 x10E3/uL (ref 0.0–0.4)
Eos: 1 %
HCV Ab: NONREACTIVE
HIV Screen 4th Generation wRfx: NONREACTIVE
Hematocrit: 42.8 % (ref 34.0–46.6)
Hemoglobin: 13.9 g/dL (ref 11.1–15.9)
Hepatitis B Surface Ag: NEGATIVE
Immature Grans (Abs): 0 x10E3/uL (ref 0.0–0.1)
Immature Granulocytes: 0 %
Lymphocytes Absolute: 1.4 x10E3/uL (ref 0.7–3.1)
Lymphs: 13 %
MCH: 28.5 pg (ref 26.6–33.0)
MCHC: 32.5 g/dL (ref 31.5–35.7)
MCV: 88 fL (ref 79–97)
Monocytes Absolute: 0.6 x10E3/uL (ref 0.1–0.9)
Monocytes: 5 %
Neutrophils Absolute: 8.9 x10E3/uL — ABNORMAL HIGH (ref 1.4–7.0)
Neutrophils: 80 %
Platelets: 303 x10E3/uL (ref 150–450)
RBC: 4.87 x10E6/uL (ref 3.77–5.28)
RDW: 13.4 % (ref 11.7–15.4)
RPR Ser Ql: NONREACTIVE
Rh Factor: POSITIVE
Rubella Antibodies, IGG: 7.55 {index} (ref >0.99)
WBC: 11.2 x10E3/uL — ABNORMAL HIGH (ref 3.4–10.8)

## 2024-06-23 LAB — HEMOGLOBIN A1C
Est. average glucose Bld gHb Est-mCnc: 111 mg/dL
Hgb A1c MFr Bld: 5.5 % (ref 4.8–5.6)

## 2024-06-23 LAB — HCV INTERPRETATION

## 2024-06-24 LAB — URINE CULTURE

## 2024-06-27 ENCOUNTER — Ambulatory Visit: Payer: Self-pay | Admitting: Women's Health

## 2024-06-27 DIAGNOSIS — O0990 Supervision of high risk pregnancy, unspecified, unspecified trimester: Secondary | ICD-10-CM

## 2024-06-28 LAB — PANORAMA PRENATAL TEST FULL PANEL:PANORAMA TEST PLUS 5 ADDITIONAL MICRODELETIONS: FETAL FRACTION: 6.3

## 2024-07-19 ENCOUNTER — Ambulatory Visit (INDEPENDENT_AMBULATORY_CARE_PROVIDER_SITE_OTHER): Admitting: Women's Health

## 2024-07-19 ENCOUNTER — Encounter: Payer: Self-pay | Admitting: Women's Health

## 2024-07-19 VITALS — BP 117/65 | HR 110 | Wt 266.0 lb

## 2024-07-19 DIAGNOSIS — Z6841 Body Mass Index (BMI) 40.0 and over, adult: Secondary | ICD-10-CM

## 2024-07-19 DIAGNOSIS — Z3A16 16 weeks gestation of pregnancy: Secondary | ICD-10-CM

## 2024-07-19 DIAGNOSIS — O0990 Supervision of high risk pregnancy, unspecified, unspecified trimester: Secondary | ICD-10-CM | POA: Diagnosis not present

## 2024-07-19 DIAGNOSIS — Z3A15 15 weeks gestation of pregnancy: Secondary | ICD-10-CM

## 2024-07-19 DIAGNOSIS — O0992 Supervision of high risk pregnancy, unspecified, second trimester: Secondary | ICD-10-CM | POA: Diagnosis not present

## 2024-07-19 DIAGNOSIS — Z363 Encounter for antenatal screening for malformations: Secondary | ICD-10-CM

## 2024-07-19 NOTE — Progress Notes (Signed)
 HIGH-RISK PREGNANCY VISIT Patient name: Monique Fox MRN [id number]  Date of birth: [birth year] Chief Complaint:   Routine Prenatal Visit  History of Present Illness:   Monique Fox is a 28 y.o. G35P0101 female at [redacted]w[redacted]d with an Estimated Date of Delivery: 01/03/25 being seen today for ongoing management of a high-risk pregnancy complicated by PG BMI 47.    Today she reports no complaints. Contractions: Not present. Vag. Bleeding: None.  Movement: Present. denies leaking of fluid.      06/21/2024    9:23 AM 08/04/2022    9:27 AM 01/23/2022   10:02 AM 02/24/2017    9:08 AM  Depression screen PHQ 2/9  Decreased Interest 0 0 0 0  Down, Depressed, Hopeless 0 0 0 0  PHQ - 2 Score 0 0 0 0  Altered sleeping 0 0 1   Tired, decreased energy 1 1 1    Change in appetite 0 0 1   Feeling bad or failure about yourself  0 0 1   Trouble concentrating 0 0 0   Moving slowly or fidgety/restless 0 0 0   Suicidal thoughts 0 0 0   PHQ-9 Score 1 1 4          06/21/2024    9:23 AM 08/04/2022    9:28 AM 01/23/2022   10:03 AM  GAD 7 : Generalized Anxiety Score  Nervous, Anxious, on Edge 0 0 2  Control/stop worrying 0 0 2  Worry too much - different things 0 0 2  Trouble relaxing 0 0 2  Restless 0 0 1  Easily annoyed or irritable 0 0 1  Afraid - awful might happen 0 0 0  Total GAD 7 Score 0 0 10     Review of Systems:   Pertinent items are noted in HPI Denies abnormal vaginal discharge w/ itching/odor/irritation, headaches, visual changes, shortness of breath, chest pain, abdominal pain, severe nausea/vomiting, or problems with urination or bowel movements unless otherwise stated above. Pertinent History Reviewed:  Reviewed past medical,surgical, social, obstetrical and family history.  Reviewed problem list, medications and allergies. Physical Assessment:   Vitals:   07/19/24 0904 07/19/24 0905  BP: 138/79 117/65  Pulse: (!) 108 (!) 110  Weight: 266 lb (120.7 kg)   Body mass index is 47.12  kg/m.           Physical Examination:   General appearance: alert, well appearing, and in no distress  Mental status: alert, oriented to person, place, and time  Skin: warm & dry   Extremities:      Cardiovascular: normal heart rate noted  Respiratory: normal respiratory effort, no distress  Abdomen: gravid, soft, non-tender  Pelvic: Cervical exam deferred         Fetal Status: Fetal Heart Rate (bpm): 160   Movement: Present    Fetal Surveillance Testing today: doppler   Chaperone: N/A  No results found for this or any previous visit (from the past 24 hours).  Assessment & Plan:  High-risk pregnancy: G2P0101 at [redacted]w[redacted]d with an Estimated Date of Delivery: 01/03/25   1) PGBMI 47, stable  2) H/O 36wk PTB d/t PPROM  Meds: No orders of the defined types were placed in this encounter.   Labs/procedures today: AFP  Treatment Plan:  U/S 28, 32, 36wks   2x/wk nst or weekly BPP @ 34-36wks   Deliver @ 39-40.6wks   Reviewed: Preterm labor symptoms and general obstetric precautions including but not limited to vaginal bleeding, contractions, leaking of fluid and  fetal movement were reviewed in detail with the patient.  All questions were answered. Does have home bp cuff. Office bp cuff given: not applicable. Check bp weekly, let us  know if consistently >140 and/or >90.  Follow-up: Return for As scheduled.   Future Appointments  Date Time Provider Department Center  08/16/2024 10:00 AM [facility] 2 [facility] None  08/16/2024 11:10 AM Makyi Ledo, Suzen SAUNDERS, CNM [facility]    Orders Placed This Encounter  Procedures   US  OB Comp + 14 Wk   AFP, Serum, Open Spina [address]   Suzen SAUNDERS Fetters CNM, [facility] 07/19/2024 9:13 AM

## 2024-07-19 NOTE — Patient Instructions (Signed)
Monique Fox, thank you for choosing our office today! We appreciate the opportunity to meet your healthcare needs. You may receive a short survey by mail, e-mail, or through MyChart. If you are happy with your care we would appreciate if you could take just a few minutes to complete the survey questions. We read all of your comments and take your feedback very seriously. Thank you again for choosing our office.  [facility] Team at [facility] ([address] [place], [place] [address]) Entrance C, located off of [address] Free 24/7 valet parking  Go to [email or web address] to register for FREE online childbirth classes  Call the office ([phone]) or go to [facility] if: You begin to severe cramping Your water breaks.  Sometimes it is a big gush of fluid, sometimes it is just a trickle that keeps getting your panties wet or running down your legs You have vaginal bleeding.  It is normal to have a small amount of spotting if your cervix was checked.   [place] Pediatricians/Family Doctors [facility] ([facility]): [address], [phone]           [facility]: [address], [phone]                [facility] ([facility]): [address], [phone] (call to ask if accepting patients) [facility]: [address], [place], [phone]    [place] Pediatricians/Family Doctors [facility] ([facility]): [address], [phone] [facility]: [address], [phone] [facility]: [address], [phone]  [place] [facility]  [facility] ([facility]): [phone] [facility]: [address], [phone]   [place] Family Doctors [facility]: [address], [phone]  [place] [facility]  [facility]: [address], [phone]  Home Blood Pressure Monitoring for Patients   Your provider has recommended that you check your blood pressure (BP) at least once a week at home. If you do not have a blood pressure cuff at home, one will be provided for you. Contact your provider if you have not received your monitor within 1 week.   Helpful Tips for Accurate Home Blood Pressure Checks  Don't smoke, exercise, or drink caffeine 30 minutes before checking your BP Use the restroom before checking your BP (a full bladder can raise your pressure) Relax in a comfortable upright chair Feet on the ground Left arm resting comfortably on a flat surface at the level of your heart Legs uncrossed Back supported Sit quietly and don't talk Place the cuff on your bare arm Adjust snuggly, so that only two fingertips can fit between your skin and the top of the cuff Check 2 readings separated by at least one minute Keep a log of your BP readings For a visual, please reference this diagram: http://ccnc.care/bpdiagram  Provider Name: [facility]     Phone: [phone]  Zone 1: ALL CLEAR  Continue to monitor your symptoms:  BP reading is less than 140 (top number) or less than 90 (bottom number)  No right upper stomach pain No headaches or seeing spots No feeling nauseated or throwing up No swelling in face and hands  Zone 2: CAUTION Call your doctor's office for any of the following:  BP reading is greater than 140 (top number) or greater than   90 (bottom number)  Stomach pain under your ribs in the middle or right side Headaches or seeing spots Feeling nauseated or throwing up Swelling in face and hands  Zone 3: EMERGENCY  Seek immediate medical care if you have any of the following:  BP reading is greater than160 (top number) or greater than 110 (bottom number) Severe headaches not improving with Tylenol Serious difficulty catching your breath Any worsening symptoms from  Zone 2     Second Trimester of Pregnancy The second trimester is from week 14 through week 27 (months 4 through 6). The second trimester is often a time when you feel your best. Your body has adjusted to being pregnant, and you begin to feel better physically. Usually, morning sickness has lessened or quit completely, you may have more energy, and you may have an increase in appetite. The second trimester is also a time when the fetus is growing rapidly. At the end of the sixth month, the fetus is about 9 inches long and weighs about 1 pounds. You will likely begin to feel the baby move (quickening) between 16 and 20 weeks of pregnancy. Body changes during your second trimester Your body continues to go through many changes during your second trimester. The changes vary from woman to woman. Your weight will continue to increase. You will notice your lower abdomen bulging out. You may begin to get stretch marks on your hips, abdomen, and breasts. You may develop headaches that can be relieved by medicines. The medicines should be approved by your health care provider. You may urinate more often because the fetus is pressing on your bladder. You may develop or continue to have heartburn as a result of your pregnancy. You may develop constipation because certain hormones are causing the muscles that push waste through your intestines to slow down. You may develop hemorrhoids or swollen, bulging veins (varicose veins). You may have back pain. This is caused by: Weight gain. Pregnancy hormones that are relaxing the joints in your pelvis. A shift in weight and the muscles that support your balance. Your breasts will continue to grow and they will continue to become tender. Your gums may bleed and may be sensitive to brushing and flossing. Dark spots or blotches (chloasma, mask of pregnancy) may develop on your face. This will likely fade after the baby is born. A dark line from your belly button to  the pubic area (linea nigra) may appear. This will likely fade after the baby is born. You may have changes in your hair. These can include thickening of your hair, rapid growth, and changes in texture. Some women also have hair loss during or after pregnancy, or hair that feels dry or thin. Your hair will most likely return to normal after your baby is born.  What to expect at prenatal visits During a routine prenatal visit: You will be weighed to make sure you and the fetus are growing normally. Your blood pressure will be taken. Your abdomen will be measured to track your baby's growth. The fetal heartbeat will be listened to. Any test results from the previous visit will be discussed.  Your health care provider may ask you: How you are feeling. If you are feeling the baby move. If you have had any abnormal symptoms, such as leaking fluid, bleeding, severe headaches, or abdominal cramping. If you are using any tobacco products, including cigarettes, chewing tobacco, and electronic cigarettes. If you have any questions.  Other tests that may be performed during   your second trimester include: Blood tests that check for: Low iron levels (anemia). High blood sugar that affects pregnant women (gestational diabetes) between 24 and 28 weeks. Rh antibodies. This is to check for a protein on red blood cells (Rh factor). Urine tests to check for infections, diabetes, or protein in the urine. An ultrasound to confirm the proper growth and development of the baby. An amniocentesis to check for possible genetic problems. Fetal screens for spina bifida and Down syndrome. HIV (human immunodeficiency virus) testing. Routine prenatal testing includes screening for HIV, unless you choose not to have this test.  Follow these instructions at home: Medicines Follow your health care provider's instructions regarding medicine use. Specific medicines may be either safe or unsafe to take during  pregnancy. Take a prenatal vitamin that contains at least 600 micrograms (mcg) of folic acid. If you develop constipation, try taking a stool softener if your health care provider approves. Eating and drinking Eat a balanced diet that includes fresh fruits and vegetables, whole grains, good sources of protein such as meat, eggs, or tofu, and low-fat dairy. Your health care provider will help you determine the amount of weight gain that is right for you. Avoid raw meat and uncooked cheese. These carry germs that can cause birth defects in the baby. If you have low calcium intake from food, talk to your health care provider about whether you should take a daily calcium supplement. Limit foods that are high in fat and processed sugars, such as fried and sweet foods. To prevent constipation: Drink enough fluid to keep your urine clear or pale yellow. Eat foods that are high in fiber, such as fresh fruits and vegetables, whole grains, and beans. Activity Exercise only as directed by your health care provider. Most women can continue their usual exercise routine during pregnancy. Try to exercise for 30 minutes at least 5 days a week. Stop exercising if you experience uterine contractions. Avoid heavy lifting, wear low heel shoes, and practice good posture. A sexual relationship may be continued unless your health care provider directs you otherwise. Relieving pain and discomfort Wear a good support bra to prevent discomfort from breast tenderness. Take warm sitz baths to soothe any pain or discomfort caused by hemorrhoids. Use hemorrhoid cream if your health care provider approves. Rest with your legs elevated if you have leg cramps or low back pain. If you develop varicose veins, wear support hose. Elevate your feet for 15 minutes, 3-4 times a day. Limit salt in your diet. Prenatal Care Write down your questions. Take them to your prenatal visits. Keep all your prenatal visits as told by your health  care provider. This is important. Safety Wear your seat belt at all times when driving. Make a list of emergency phone numbers, including numbers for family, friends, the hospital, and police and fire departments. General instructions Ask your health care provider for a referral to a local prenatal education class. Begin classes no later than the beginning of month 6 of your pregnancy. Ask for help if you have counseling or nutritional needs during pregnancy. Your health care provider can offer advice or refer you to specialists for help with various needs. Do not use hot tubs, steam rooms, or saunas. Do not douche or use tampons or scented sanitary pads. Do not cross your legs for long periods of time. Avoid cat litter boxes and soil used by cats. These carry germs that can cause birth defects in the baby and possibly loss of the   fetus by miscarriage or stillbirth. Avoid all smoking, herbs, alcohol, and unprescribed drugs. Chemicals in these products can affect the formation and growth of the baby. Do not use any products that contain nicotine or tobacco, such as cigarettes and e-cigarettes. If you need help quitting, ask your health care provider. Visit your dentist if you have not gone yet during your pregnancy. Use a soft toothbrush to brush your teeth and be gentle when you floss. Contact a health care provider if: You have dizziness. You have mild pelvic cramps, pelvic pressure, or nagging pain in the abdominal area. You have persistent nausea, vomiting, or diarrhea. You have a bad smelling vaginal discharge. You have pain when you urinate. Get help right away if: You have a fever. You are leaking fluid from your vagina. You have spotting or bleeding from your vagina. You have severe abdominal cramping or pain. You have rapid weight gain or weight loss. You have shortness of breath with chest pain. You notice sudden or extreme swelling of your face, hands, ankles, feet, or legs. You  have not felt your baby move in over an hour. You have severe headaches that do not go away when you take medicine. You have vision changes. Summary The second trimester is from week 14 through week 27 (months 4 through 6). It is also a time when the fetus is growing rapidly. Your body goes through many changes during pregnancy. The changes vary from woman to woman. Avoid all smoking, herbs, alcohol, and unprescribed drugs. These chemicals affect the formation and growth your baby. Do not use any tobacco products, such as cigarettes, chewing tobacco, and e-cigarettes. If you need help quitting, ask your health care provider. Contact your health care provider if you have any questions. Keep all prenatal visits as told by your health care provider. This is important. This information is not intended to replace advice given to you by your health care provider. Make sure you discuss any questions you have with your health care provider. Document Released: 10/14/2001 Document Revised: 03/27/2016 Document Reviewed: 12/21/2012 Elsevier Interactive Patient Education  2017 [employer]vier Inc.  

## 2024-07-21 ENCOUNTER — Ambulatory Visit: Payer: Self-pay | Admitting: Women's Health

## 2024-07-21 DIAGNOSIS — O0990 Supervision of high risk pregnancy, unspecified, unspecified trimester: Secondary | ICD-10-CM

## 2024-07-21 LAB — AFP, SERUM, OPEN SPINA BIFIDA
AFP MoM: 1.46
AFP Value: 34.6 ng/mL
Gest. Age on Collection Date: 16 wk
Maternal Age At EDD: 29.9 a
OSBR Risk 1 IN: 3048
Test Results:: NEGATIVE
Weight: 266 [lb_av]

## 2024-07-27 ENCOUNTER — Encounter: Payer: Self-pay | Admitting: Women's Health

## 2024-07-28 ENCOUNTER — Other Ambulatory Visit: Payer: Self-pay | Admitting: Women's Health

## 2024-07-28 MED ORDER — PREDNISONE 20 MG PO TABS: 40.0000 mg | ORAL_TABLET | Freq: Every day | ORAL | 0 refills | Status: DC

## 2024-08-12 MED ORDER — HYDROXYZINE HCL 10 MG PO TABS: 10.0000 mg | ORAL_TABLET | Freq: Three times a day (TID) | ORAL | 0 refills | Status: DC | PRN

## 2024-08-16 ENCOUNTER — Ambulatory Visit (INDEPENDENT_AMBULATORY_CARE_PROVIDER_SITE_OTHER): Admitting: Obstetrics and Gynecology

## 2024-08-16 ENCOUNTER — Ambulatory Visit (INDEPENDENT_AMBULATORY_CARE_PROVIDER_SITE_OTHER): Admitting: Radiology

## 2024-08-16 VITALS — BP 131/84 | HR 106 | Wt 267.4 lb

## 2024-08-16 DIAGNOSIS — O0990 Supervision of high risk pregnancy, unspecified, unspecified trimester: Secondary | ICD-10-CM

## 2024-08-16 DIAGNOSIS — O2686 Pruritic urticarial papules and plaques of pregnancy (PUPPP): Secondary | ICD-10-CM

## 2024-08-16 DIAGNOSIS — O0992 Supervision of high risk pregnancy, unspecified, second trimester: Secondary | ICD-10-CM

## 2024-08-16 DIAGNOSIS — O99212 Obesity complicating pregnancy, second trimester: Secondary | ICD-10-CM | POA: Diagnosis not present

## 2024-08-16 DIAGNOSIS — Z3A19 19 weeks gestation of pregnancy: Secondary | ICD-10-CM

## 2024-08-16 DIAGNOSIS — D259 Leiomyoma of uterus, unspecified: Secondary | ICD-10-CM | POA: Diagnosis not present

## 2024-08-16 DIAGNOSIS — Z8751 Personal history of pre-term labor: Secondary | ICD-10-CM | POA: Diagnosis not present

## 2024-08-16 DIAGNOSIS — Z3A20 20 weeks gestation of pregnancy: Secondary | ICD-10-CM

## 2024-08-16 DIAGNOSIS — Z363 Encounter for antenatal screening for malformations: Secondary | ICD-10-CM | POA: Diagnosis not present

## 2024-08-16 DIAGNOSIS — O3412 Maternal care for benign tumor of corpus uteri, second trimester: Secondary | ICD-10-CM

## 2024-08-16 DIAGNOSIS — Z6841 Body Mass Index (BMI) 40.0 and over, adult: Secondary | ICD-10-CM

## 2024-08-16 MED ORDER — HYDROXYZINE HCL 10 MG PO TABS: 10.0000 mg | ORAL_TABLET | Freq: Three times a day (TID) | ORAL | 3 refills | Status: AC | PRN

## 2024-08-16 NOTE — Progress Notes (Signed)
   PRENATAL VISIT NOTE  Subjective:  Monique Fox is a 28 y.o. G2P0101 at [redacted]w[redacted]d being seen today for ongoing prenatal care.  She is currently monitored for the following issues for this high-risk pregnancy and has Anxiety; Fibroid; History of preterm delivery; Supervision of high risk pregnancy, antepartum; BMI 45.0-49.9, adult (HCC); and Asthma on their problem list.  Patient reports rash on stomach, arms, legs.  Contractions: Not present. Vag. Bleeding: None.  Movement: Present. Denies leaking of fluid.   The following portions of the patient's history were reviewed and updated as appropriate: allergies, current medications, past family history, past medical history, past social history, past surgical history and problem list.   Objective:    Vitals:   08/16/24 1025  BP: 131/84  Pulse: (!) 106  Weight: 267 lb 6.4 oz (121.3 kg)    Fetal Status:      Movement: Present    General: Alert, oriented and cooperative. Patient is in no acute distress.  Skin: Skin is warm and dry. No rash noted.   Cardiovascular: Normal heart rate noted  Respiratory: Normal respiratory effort, no problems with respiration noted  Abdomen: Soft, gravid, appropriate for gestational age.  Pain/Pressure: Absent     Pelvic: Cervical exam deferred        Extremities: Normal range of motion.     Mental Status: Normal mood and affect. Normal behavior. Normal judgment and thought content.   Fetal surveillance: Single active female fetus, cephalic, FHR = 164 bpm, posterior pl, SVP = 3.3 cm, nl amn fluid vol, EFW 89%, 385g, small intramural fibroid mid anterior wall = 15 x 9 mm, nl ov's, CL = 4.1 cm, closed, Incomplete Cardiac screen due to fetal position and decreased resolution   Assessment and Plan:  Pregnancy: G2P0101 at [redacted]w[redacted]d 1. Supervision of high risk pregnancy, antepartum (Primary) BP normal Starting feeling movement    2. [redacted] weeks gestation of pregnancy -U/s normal growth today, unable to complete  cardiac screen, will schedule follow up  -provided peds list - received flu at work  - [place]  OB Follow Up; Future  3. BMI 45.0-49.9, adult (HCC)   4. History of preterm delivery At 36 weeks, monitor s&s   5. PUPP (pruritic urticarial papules and plaques of pregnancy) Has previously tried topical steroid, oral steroid, without relief. She has started using vitamin e and tea trea oil that is helping some as well as aveeno. She takes zyrtec daily and hydroxyzine to help make the itching manageable, and feels this regimen is helping the most. Discussed likely continue until delivery. F/u if symptoms worsen  -refill hydroxyzine   Preterm labor symptoms and general obstetric precautions including but not limited to vaginal bleeding, contractions, leaking of fluid and fetal movement were reviewed in detail with the patient. Please refer to After Visit Summary for other counseling recommendations.   Return in about 4 weeks (around 09/13/2024), or follow up ultrasound, for OB VISIT (MD or APP).   Monique Daring, FNP

## 2024-08-16 NOTE — Progress Notes (Signed)
 GA 20 w Single active female fetus, cephalic, FHR = 164 bpm, posterior pl, SVP = 3.3 cm, nl amn fluid vol, EFW 89%, 385g, small intramural fibroid mid anterior wall = 15 x 9 mm, nl ov's, CL = 4.1 cm, closed, Incomplete Cardiac screen due to fetal position and decreased resolution

## 2024-09-13 ENCOUNTER — Ambulatory Visit (INDEPENDENT_AMBULATORY_CARE_PROVIDER_SITE_OTHER): Admitting: Women's Health

## 2024-09-13 ENCOUNTER — Ambulatory Visit

## 2024-09-13 ENCOUNTER — Encounter: Payer: Self-pay | Admitting: Women's Health

## 2024-09-13 VITALS — BP 131/85 | HR 90 | Wt 273.0 lb

## 2024-09-13 DIAGNOSIS — E669 Obesity, unspecified: Secondary | ICD-10-CM

## 2024-09-13 DIAGNOSIS — Z3A24 24 weeks gestation of pregnancy: Secondary | ICD-10-CM

## 2024-09-13 DIAGNOSIS — Z3A20 20 weeks gestation of pregnancy: Secondary | ICD-10-CM | POA: Diagnosis not present

## 2024-09-13 DIAGNOSIS — Z6841 Body Mass Index (BMI) 40.0 and over, adult: Secondary | ICD-10-CM

## 2024-09-13 DIAGNOSIS — O0992 Supervision of high risk pregnancy, unspecified, second trimester: Secondary | ICD-10-CM | POA: Diagnosis not present

## 2024-09-13 DIAGNOSIS — O0990 Supervision of high risk pregnancy, unspecified, unspecified trimester: Secondary | ICD-10-CM

## 2024-09-13 DIAGNOSIS — O99212 Obesity complicating pregnancy, second trimester: Secondary | ICD-10-CM

## 2024-09-13 NOTE — Progress Notes (Signed)
 HIGH-RISK PREGNANCY VISIT Patient name: Monique Fox MRN [id number]  Date of birth: 09/12/1996 Chief Complaint:   Routine Prenatal Visit  History of Present Illness:   Monique Fox is a 28 y.o. G69P0101 female at [redacted]w[redacted]d with an Estimated Date of Delivery: 01/03/25 being seen today for ongoing management of a high-risk pregnancy complicated by PG BMI 47.    Today she reports woke up one morning and rash was gone!. Contractions: Not present. Vag. Bleeding: None.  Movement: Present. denies leaking of fluid.      06/21/2024    9:23 AM 08/04/2022    9:27 AM 01/23/2022   10:02 AM 02/24/2017    9:08 AM  Depression screen PHQ 2/9  Decreased Interest 0 0 0 0  Down, Depressed, Hopeless 0 0 0 0  PHQ - 2 Score 0 0 0 0  Altered sleeping 0 0 1   Tired, decreased energy 1 1 1    Change in appetite 0 0 1   Feeling bad or failure about yourself  0 0 1   Trouble concentrating 0 0 0   Moving slowly or fidgety/restless 0 0 0   Suicidal thoughts 0 0 0   PHQ-9 Score 1  1  4        Data saved with a previous flowsheet row definition        06/21/2024    9:23 AM 08/04/2022    9:28 AM 01/23/2022   10:03 AM  GAD 7 : Generalized Anxiety Score  Nervous, Anxious, on Edge 0 0 2  Control/stop worrying 0 0 2  Worry too much - different things 0 0 2  Trouble relaxing 0 0 2  Restless 0 0 1  Easily annoyed or irritable 0 0 1  Afraid - awful might happen 0 0 0  Total GAD 7 Score 0 0 10     Review of Systems:   Pertinent items are noted in HPI Denies abnormal vaginal discharge w/ itching/odor/irritation, headaches, visual changes, shortness of breath, chest pain, abdominal pain, severe nausea/vomiting, or problems with urination or bowel movements unless otherwise stated above. Pertinent History Reviewed:  Reviewed past medical,surgical, social, obstetrical and family history.  Reviewed problem list, medications and allergies. Physical Assessment:   Vitals:   09/13/24 1505  BP: 131/85  Pulse: 90   Weight: 273 lb (123.8 kg)  Body mass index is 48.36 kg/m.           Physical Examination:   General appearance: alert, well appearing, and in no distress  Mental status: alert, oriented to person, place, and time  Skin: warm & dry   Extremities:      Cardiovascular: normal heart rate noted  Respiratory: normal respiratory effort, no distress  Abdomen: gravid, soft, non-tender  Pelvic: Cervical exam deferred         Fetal Status:     Movement: Present    Fetal Surveillance Testing today: US  24 wks,breech,posterior placenta gr 0,CX 3.4 cm,SVP of fluid 4.9 cm,FHR 170 bpm,EFW 754 g 83%,anatomy of the heart complete,no obvious abnormalities   Chaperone: N/A  No results found for this or any previous visit (from the past 24 hours).  Assessment & Plan:  High-risk pregnancy: G2P0101 at [redacted]w[redacted]d with an Estimated Date of Delivery: 01/03/25   1) PGBMI 47, currently 48  2) H/O 36wk PTB d/t PPROM  3) ?PUPPPS> completely resolved  4) Borderline bp> check weekly, if >140/90 or pre-e sx let us  know  Meds: No orders of the defined types were  placed in this encounter.   Labs/procedures today: U/S  Treatment Plan:  EFW q4w @ 24-28w   2x/wk nst or weekly BPP @ 34-36wks   Deliver @ 39-40.6wks   Reviewed: Preterm labor symptoms and general obstetric precautions including but not limited to vaginal bleeding, contractions, leaking of fluid and fetal movement were reviewed in detail with the patient.  All questions were answered. Does have home bp cuff. Office bp cuff given: not applicable. Check bp weekly, let us  know if consistently >140 and/or >90.  Follow-up: Return in about 4 weeks (around 10/11/2024) for HROB, PN2, US :EFW, MD or CNM, in person.   Future Appointments  Date Time Provider Department Center  10/12/2024  8:00 AM [facility]  10/12/2024  8:30 AM [facility] IMG 2 [facility] None  10/12/2024  9:30 AM Ozan, Jennifer, DO [facility]     Orders Placed This  Encounter  Procedures   US  OB Follow Up   [name] CNM, [facility] 09/13/2024 3:32 PM

## 2024-09-13 NOTE — Progress Notes (Signed)
 US  24 wks,breech,posterior placenta gr 0,CX 3.4 cm,SVP of fluid 4.9 cm,FHR 170 bpm,EFW 754 g 83%,anatomy of the heart complete,no obvious abnormalities

## 2024-09-13 NOTE — Patient Instructions (Signed)
Monique Fox, thank you for choosing our office today! We appreciate the opportunity to meet your healthcare needs. You may receive a short survey by mail, e-mail, or through [employer]. If you are happy with your care we would appreciate if you could take just a few minutes to complete the survey questions. We read all of your comments and take your feedback very seriously. Thank you again for choosing our office.  Center for [employer] Team at [facility]  [facility] at [facility] ([address] [place], [place] [address]) Entrance C, located off of [address] Free 24/7 valet parking   You will have your sugar test next visit.  Please do not eat or drink anything after midnight the night before you come, not even water.  You will be here for at least two hours.  Please make an appointment online for the bloodwork at [email or web address] for 8:00am (or as close to this as possible). Make sure you select the [facility] service center.   CLASSES: Go to [email or web address] to register for classes (childbirth, breastfeeding, waterbirth, infant CPR, daddy bootcamp, etc.)  Call the office [phone]) or go to [facility] if: You begin to have strong, frequent contractions Your water breaks.  Sometimes it is a big gush of fluid, sometimes it is just a trickle that keeps getting your panties wet or running down your legs You have vaginal bleeding.  It is normal to have a small amount of spotting if your cervix was checked.  You don't feel your baby moving like normal.  If you don't, get you something to eat and drink and lay down and focus on feeling your baby move.   If your baby is still not moving like normal, you should call the office or go to [facility].  Call the office [phone]) or go to [facility] hospital for these signs of pre-eclampsia: Severe headache that does not go away with Tylenol Visual changes- seeing spots, double, blurred vision Pain under your right breast or upper  abdomen that does not go away with Tums or heartburn medicine Nausea and/or vomiting Severe swelling in your hands, feet, and face    [place]ians/Family Doctors [facility]): [address] Colette Ribas, [phone]           [facility]: [address], [phone]                [facility]): [address], [phone]  [facility] Department: [address], [place], [phone]    [facility] Pediatricians/Family Doctors [facility]): [address], [phone] [facility]: [address] [place], [phone] [facility]: [address], [phone]  [facility] Doctors  [facility]): [phone] [facility]: [address] [phone]   [facility] Doctors [facility]: [address], [phone]  [facility] Doctors  [facility]: [phone], [phone]  Home Blood Pressure Monitoring for Patients   Your provider has recommended that you check your blood pressure (BP) at least once a week at home. If you do not have a blood pressure cuff at home, one will be provided for you. Contact your provider if you have not received your monitor within 1 week.   Helpful Tips for Accurate Home Blood Pressure Checks  Don't smoke, exercise, or drink caffeine 30 minutes before checking  your BP Use the restroom before checking your BP (a full bladder can raise your pressure) Relax in a comfortable upright chair Feet on the ground Left arm resting comfortably on a flat surface at the level of your heart Legs uncrossed Back supported Sit quietly and don't talk Place the cuff on your bare arm Adjust snuggly, so that only two fingertips can fit between your skin and the top of the cuff Check 2 readings separated by at least one  minute Keep a log of your BP readings For a visual, please reference this diagram: http://ccnc.care/bpdiagram  Provider Name: [facility]     Phone: [phone]  Zone 1: ALL CLEAR  Continue to monitor your symptoms:  BP reading is less than 140 (top number) or less than 90 (bottom number)  No right upper stomach pain No headaches or seeing spots No feeling nauseated or throwing up No swelling in face and hands  Zone 2: CAUTION Call your doctor's office for any of the following:  BP reading is greater than 140 (top number) or greater than 90 (bottom number)  Stomach pain under your ribs in the middle or right side Headaches or seeing spots Feeling nauseated or throwing up Swelling in face and hands  Zone 3: EMERGENCY  Seek immediate medical care if you have any of the following:  BP reading is greater than160 (top number) or greater than 110 (bottom number) Severe headaches not improving with Tylenol Serious difficulty catching your breath Any worsening symptoms from Zone 2   Second Trimester of Pregnancy The second trimester is from week 13 through week 28, months 4 through 6. The second trimester is often a time when you feel your best. Your body has also adjusted to being pregnant, and you begin to feel better physically. Usually, morning sickness has lessened or quit completely, you may have more energy, and you may have an increase in appetite. The second trimester is also a time when the fetus is growing rapidly. At the end of the sixth month, the fetus is about 9 inches long and weighs about 1 pounds. You will likely begin to feel the baby move (quickening) between 18 and 20 weeks of the pregnancy. BODY CHANGES Your body goes through many changes during pregnancy. The changes vary from woman to woman.  Your weight will continue to increase. You will notice your lower abdomen bulging out. You may begin to get stretch marks on your hips, abdomen, and breasts. You may  develop headaches that can be relieved by medicines approved by your health care provider. You may urinate more often because the fetus is pressing on your bladder. You may develop or continue to have heartburn as a result of your pregnancy. You may develop constipation because certain hormones are causing the muscles that push waste through your intestines to slow down. You may develop hemorrhoids or swollen, bulging veins (varicose veins). You may have back pain because of the weight gain and pregnancy hormones relaxing your joints between the bones in your pelvis and as a result of a shift in weight and the muscles that support your balance. Your breasts will continue to grow and be tender. Your gums may bleed and may be sensitive to brushing and flossing. Dark spots or blotches (chloasma, mask of pregnancy) may develop on your face. This will likely fade after the baby is born. A dark line from your belly button to the pubic area (linea nigra) may appear. This will likely fade after the  baby is born. You may have changes in your hair. These can include thickening of your hair, rapid growth, and changes in texture. Some women also have hair loss during or after pregnancy, or hair that feels dry or thin. Your hair will most likely return to normal after your baby is born. WHAT TO EXPECT AT YOUR PRENATAL VISITS During a routine prenatal visit: You will be weighed to make sure you and the fetus are growing normally. Your blood pressure will be taken. Your abdomen will be measured to track your baby's growth. The fetal heartbeat will be listened to. Any test results from the previous visit will be discussed. Your health care provider may ask you: How you are feeling. If you are feeling the baby move. If you have had any abnormal symptoms, such as leaking fluid, bleeding, severe headaches, or abdominal cramping. If you have any questions. Other tests that may be performed during your second  trimester include: Blood tests that check for: Low iron levels (anemia). Gestational diabetes (between 24 and 28 weeks). Rh antibodies. Urine tests to check for infections, diabetes, or protein in the urine. An ultrasound to confirm the proper growth and development of the baby. An amniocentesis to check for possible genetic problems. Fetal screens for spina bifida and Down syndrome. HOME CARE INSTRUCTIONS  Avoid all smoking, herbs, alcohol, and unprescribed drugs. These chemicals affect the formation and growth of the baby. Follow your health care provider's instructions regarding medicine use. There are medicines that are either safe or unsafe to take during pregnancy. Exercise only as directed by your health care provider. Experiencing uterine cramps is a good sign to stop exercising. Continue to eat regular, healthy meals. Wear a good support bra for breast tenderness. Do not use hot tubs, steam rooms, or saunas. Wear your seat belt at all times when driving. Avoid raw meat, uncooked cheese, cat litter boxes, and soil used by cats. These carry germs that can cause birth defects in the baby. Take your prenatal vitamins. Try taking a stool softener (if your health care provider approves) if you develop constipation. Eat more high-fiber foods, such as fresh vegetables or fruit and whole grains. Drink plenty of fluids to keep your urine clear or pale yellow. Take warm sitz baths to soothe any pain or discomfort caused by hemorrhoids. Use hemorrhoid cream if your health care provider approves. If you develop varicose veins, wear support hose. Elevate your feet for 15 minutes, 3-4 times a day. Limit salt in your diet. Avoid heavy lifting, wear low heel shoes, and practice good posture. Rest with your legs elevated if you have leg cramps or low back pain. Visit your dentist if you have not gone yet during your pregnancy. Use a soft toothbrush to brush your teeth and be gentle when you floss. A  sexual relationship may be continued unless your health care provider directs you otherwise. Continue to go to all your prenatal visits as directed by your health care provider. SEEK MEDICAL CARE IF:  You have dizziness. You have mild pelvic cramps, pelvic pressure, or nagging pain in the abdominal area. You have persistent nausea, vomiting, or diarrhea. You have a bad smelling vaginal discharge. You have pain with urination. SEEK IMMEDIATE MEDICAL CARE IF:  You have a fever. You are leaking fluid from your vagina. You have spotting or bleeding from your vagina. You have severe abdominal cramping or pain. You have rapid weight gain or loss. You have shortness of breath with chest pain. You  notice sudden or extreme swelling of your face, hands, ankles, feet, or legs. You have not felt your baby move in over an hour. You have severe headaches that do not go away with medicine. You have vision changes. Document Released: 10/14/2001 Document Revised: 10/25/2013 Document Reviewed: 12/21/2012 [facility] Patient Information 2015 [place], [place]. This information is not intended to replace advice given to you by your health care provider. Make sure you discuss any questions you have with your health care provider.

## 2024-09-27 ENCOUNTER — Other Ambulatory Visit: Payer: Self-pay

## 2024-09-27 DIAGNOSIS — Z131 Encounter for screening for diabetes mellitus: Secondary | ICD-10-CM

## 2024-09-27 DIAGNOSIS — O0990 Supervision of high risk pregnancy, unspecified, unspecified trimester: Secondary | ICD-10-CM

## 2024-09-27 DIAGNOSIS — Z3A28 28 weeks gestation of pregnancy: Secondary | ICD-10-CM

## 2024-10-11 DIAGNOSIS — Z3483 Encounter for supervision of other normal pregnancy, third trimester: Secondary | ICD-10-CM | POA: Diagnosis not present

## 2024-10-11 DIAGNOSIS — Z3482 Encounter for supervision of other normal pregnancy, second trimester: Secondary | ICD-10-CM | POA: Diagnosis not present

## 2024-10-12 ENCOUNTER — Other Ambulatory Visit

## 2024-10-12 ENCOUNTER — Ambulatory Visit: Admitting: Obstetrics & Gynecology

## 2024-10-12 ENCOUNTER — Ambulatory Visit (INDEPENDENT_AMBULATORY_CARE_PROVIDER_SITE_OTHER): Admitting: Radiology

## 2024-10-12 ENCOUNTER — Encounter: Payer: Self-pay | Admitting: Obstetrics & Gynecology

## 2024-10-12 VITALS — BP 126/83 | HR 99 | Wt 274.0 lb

## 2024-10-12 DIAGNOSIS — Z3A28 28 weeks gestation of pregnancy: Secondary | ICD-10-CM

## 2024-10-12 DIAGNOSIS — Z6841 Body Mass Index (BMI) 40.0 and over, adult: Secondary | ICD-10-CM

## 2024-10-12 DIAGNOSIS — O09213 Supervision of pregnancy with history of pre-term labor, third trimester: Secondary | ICD-10-CM

## 2024-10-12 DIAGNOSIS — O0990 Supervision of high risk pregnancy, unspecified, unspecified trimester: Secondary | ICD-10-CM

## 2024-10-12 DIAGNOSIS — Z131 Encounter for screening for diabetes mellitus: Secondary | ICD-10-CM | POA: Diagnosis not present

## 2024-10-12 DIAGNOSIS — O0992 Supervision of high risk pregnancy, unspecified, second trimester: Secondary | ICD-10-CM

## 2024-10-12 NOTE — Progress Notes (Signed)
 US : GA = 28+1 weeks Single active female fetus, cephalic, FHR = 149 bpm, posterior fundal pl, gr1, AFI = 15.9 cm, MVP = 4.7 cm, EFW 1465g, 92%  AC 97%,  CL = 3.2 cm, closed, nl R ov, L ov not seen

## 2024-10-12 NOTE — Progress Notes (Signed)
 HIGH-RISK PREGNANCY VISIT Patient name: Monique Fox MRN [id number]  Date of birth: May 01, 1996 Chief Complaint:   Routine Prenatal Visit (PN2)  History of Present Illness:   Monique Fox is a 28 y.o. G22P0101 female at [redacted]w[redacted]d with an Estimated Date of Delivery: 01/03/25 being seen today for ongoing management of a high-risk pregnancy complicated by:  - Obesity -history of preterm delivery  Today she reports no complaints.   Contractions: Not present. Vag. Bleeding: None.  Movement: Present. denies leaking of fluid.      06/21/2024    9:23 AM 08/04/2022    9:27 AM 01/23/2022   10:02 AM 02/24/2017    9:08 AM  Depression screen PHQ 2/9  Decreased Interest 0 0 0 0  Down, Depressed, Hopeless 0 0 0 0  PHQ - 2 Score 0 0 0 0  Altered sleeping 0 0 1   Tired, decreased energy 1 1 1    Change in appetite 0 0 1   Feeling bad or failure about yourself  0 0 1   Trouble concentrating 0 0 0   Moving slowly or fidgety/restless 0 0 0   Suicidal thoughts 0 0 0   PHQ-9 Score 1  1  4        Data saved with a previous flowsheet row definition     Current Outpatient Medications  Medication Instructions   albuterol  (VENTOLIN  HFA) 108 (90 Base) MCG/ACT inhaler 2 puffs, Every 6 hours PRN   budesonide -formoterol  (SYMBICORT ) 80-4.5 MCG/ACT inhaler 2 puffs, Inhalation, 2 times daily   cetirizine (ZYRTEC) 10 mg, Daily   hydrOXYzine  (ATARAX ) 10 mg, Oral, 3 times daily PRN   ondansetron  (ZOFRAN ) 4 mg, Oral, Every 8 hours PRN   Prenatal Vit-Fe Fumarate-FA (PNV PRENATAL PLUS MULTIVITAMIN) 27-1 MG TABS 1 tablet, Oral, Daily   SYMBICORT  80-4.5 MCG/ACT inhaler 2 puffs, 2 times daily     Review of Systems:   Pertinent items are noted in HPI Denies abnormal vaginal discharge w/ itching/odor/irritation, headaches, visual changes, shortness of breath, chest pain, abdominal pain, severe nausea/vomiting, or problems with urination or bowel movements unless otherwise stated above. Pertinent History Reviewed:   Reviewed past medical,surgical, social, obstetrical and family history.  Reviewed problem list, medications and allergies. Physical Assessment:   Vitals:   10/12/24 0925  BP: 126/83  Pulse: 99  Weight: 274 lb (124.3 kg)  Body mass index is 48.54 kg/m.           Physical Examination:   General appearance: alert, well appearing, and in no distress  Mental status: normal mood, behavior, speech, dress, motor activity, and thought processes  Skin: warm & dry   Extremities: Edema: None    Cardiovascular: normal heart rate noted  Respiratory: normal respiratory effort, no distress  Abdomen: gravid, soft, non-tender  Pelvic: Cervical exam deferred         Fetal Status:     Movement: Present    Fetal Surveillance Testing today: 28+1 weeks Single active female fetus, cephalic, FHR = 149 bpm, posterior fundal pl, gr1, AFI = 15.9 cm, MVP = 4.7 cm, EFW 1465g, 92%  AC 97%,  CL = 3.2 cm, closed, nl R ov, L ov not seen   Chaperone: N/A    No results found for this or any previous visit (from the past 24 hours).   Assessment & Plan:  High-risk pregnancy: G2P0101 at [redacted]w[redacted]d with an Estimated Date of Delivery: 01/03/25   1) Obesity -LGA noted on today's US  -PN2 testing today, discussed concern  for possible GDM- further management pending results - Continue growth every 4 weeks  []  Next visit plan for Tdap and RSV vaccine  Meds: No orders of the defined types were placed in this encounter.   Labs/procedures today: growth  Treatment Plan: Routine OB care and as outlined above  Reviewed: Preterm labor symptoms and general obstetric precautions including but not limited to vaginal bleeding, contractions, leaking of fluid and fetal movement were reviewed in detail with the patient.  All questions were answered.   Follow-up: Return in about 2 weeks (around 10/26/2024) for LROB visit and growth every 4 wks.   No future appointments.   No orders of the defined types were placed in this  encounter.   Kayley Zeiders, DO Attending Obstetrician & Gynecologist, [facility] for [employer], [facility]

## 2024-10-13 ENCOUNTER — Ambulatory Visit: Payer: Self-pay | Admitting: Obstetrics & Gynecology

## 2024-10-14 ENCOUNTER — Other Ambulatory Visit: Payer: Self-pay | Admitting: Obstetrics & Gynecology

## 2024-10-14 DIAGNOSIS — O24410 Gestational diabetes mellitus in pregnancy, diet controlled: Secondary | ICD-10-CM

## 2024-10-14 LAB — CBC
Hematocrit: 35.7 % (ref 34.0–46.6)
Hemoglobin: 11.7 g/dL (ref 11.1–15.9)
MCH: 29.3 pg (ref 26.6–33.0)
MCHC: 32.8 g/dL (ref 31.5–35.7)
MCV: 90 fL (ref 79–97)
Platelets: 300 x10E3/uL (ref 150–450)
RBC: 3.99 x10E6/uL (ref 3.77–5.28)
RDW: 12.5 % (ref 11.7–15.4)
WBC: 14.9 x10E3/uL — ABNORMAL HIGH (ref 3.4–10.8)

## 2024-10-14 LAB — GLUCOSE TOLERANCE, 2 HOURS W/ 1HR
Glucose, 1 hour: 175 mg/dL (ref 70–179)
Glucose, 2 hour: 140 mg/dL (ref 70–152)
Glucose, Fasting: 111 mg/dL — ABNORMAL HIGH (ref 70–91)

## 2024-10-14 LAB — SYPHILIS: RPR W/REFLEX TO RPR TITER AND TREPONEMAL ANTIBODIES, TRADITIONAL SCREENING AND DIAGNOSIS ALGORITHM: RPR Ser Ql: NONREACTIVE

## 2024-10-14 LAB — HIV ANTIBODY (ROUTINE TESTING W REFLEX): HIV Screen 4th Generation wRfx: NONREACTIVE

## 2024-10-14 LAB — ANTIBODY SCREEN: Antibody Screen: NEGATIVE

## 2024-10-14 MED ORDER — GLUCOSE BLOOD VI STRP: ORAL_STRIP | 12 refills | Status: DC

## 2024-10-14 MED ORDER — ACCU-CHEK GUIDE ME W/DEVICE KIT: 1.0000 | PACK | Freq: Four times a day (QID) | 0 refills | Status: DC

## 2024-10-14 MED ORDER — ACCU-CHEK SOFTCLIX LANCETS MISC: 12 refills | Status: DC

## 2024-10-17 ENCOUNTER — Other Ambulatory Visit: Payer: Self-pay | Admitting: Women's Health

## 2024-10-17 ENCOUNTER — Other Ambulatory Visit: Payer: Self-pay | Admitting: Obstetrics & Gynecology

## 2024-10-17 DIAGNOSIS — O24410 Gestational diabetes mellitus in pregnancy, diet controlled: Secondary | ICD-10-CM

## 2024-10-17 MED ORDER — GLUCOSE BLOOD VI STRP: ORAL_STRIP | 12 refills | Status: DC

## 2024-10-18 ENCOUNTER — Other Ambulatory Visit (HOSPITAL_COMMUNITY): Payer: Self-pay

## 2024-10-18 ENCOUNTER — Other Ambulatory Visit: Payer: Self-pay

## 2024-10-18 DIAGNOSIS — O24410 Gestational diabetes mellitus in pregnancy, diet controlled: Secondary | ICD-10-CM

## 2024-10-18 MED ORDER — FREESTYLE LANCETS MISC
12 refills | Status: AC
  Filled 2024-10-18 – 2024-11-09 (×3): qty 100, 25d supply, fill #0

## 2024-10-18 MED ORDER — FREESTYLE LITE W/DEVICE KIT
1.0000 | PACK | Freq: Four times a day (QID) | 0 refills | Status: AC
  Filled 2024-10-18: qty 1, 30d supply, fill #0

## 2024-10-18 MED ORDER — GLUCOSE BLOOD VI STRP
ORAL_STRIP | 12 refills | Status: AC
  Filled 2024-10-18: qty 100, 25d supply, fill #0
  Filled 2024-11-09: qty 100, 25d supply, fill #1

## 2024-10-18 MED ORDER — GLUCOSE BLOOD VI STRP
ORAL_STRIP | Freq: Four times a day (QID) | 12 refills | Status: AC
  Filled 2024-10-18 (×2): qty 100, 25d supply, fill #0

## 2024-10-18 MED ORDER — GLUCOSE BLOOD VI STRP
ORAL_STRIP | 12 refills | Status: DC
  Filled 2024-10-18: qty 100, fill #0

## 2024-10-19 ENCOUNTER — Other Ambulatory Visit (HOSPITAL_COMMUNITY): Payer: Self-pay

## 2024-10-25 MED ORDER — METFORMIN HCL 500 MG PO TABS: 500.0000 mg | ORAL_TABLET | Freq: Every day | ORAL | 1 refills | Status: DC

## 2024-10-30 ENCOUNTER — Encounter: Payer: Self-pay | Admitting: Women's Health

## 2024-10-31 ENCOUNTER — Other Ambulatory Visit (HOSPITAL_COMMUNITY): Payer: Self-pay

## 2024-10-31 ENCOUNTER — Telehealth: Admitting: Women's Health

## 2024-10-31 ENCOUNTER — Encounter: Payer: Self-pay | Admitting: Women's Health

## 2024-10-31 VITALS — BP 118/88 | HR 98

## 2024-10-31 DIAGNOSIS — O09213 Supervision of pregnancy with history of pre-term labor, third trimester: Secondary | ICD-10-CM

## 2024-10-31 DIAGNOSIS — Z3A30 30 weeks gestation of pregnancy: Secondary | ICD-10-CM

## 2024-10-31 DIAGNOSIS — Z6841 Body Mass Index (BMI) 40.0 and over, adult: Secondary | ICD-10-CM

## 2024-10-31 DIAGNOSIS — O24419 Gestational diabetes mellitus in pregnancy, unspecified control: Secondary | ICD-10-CM

## 2024-10-31 DIAGNOSIS — O24415 Gestational diabetes mellitus in pregnancy, controlled by oral hypoglycemic drugs: Secondary | ICD-10-CM

## 2024-10-31 DIAGNOSIS — O0990 Supervision of high risk pregnancy, unspecified, unspecified trimester: Secondary | ICD-10-CM

## 2024-10-31 DIAGNOSIS — O3663X0 Maternal care for excessive fetal growth, third trimester, not applicable or unspecified: Secondary | ICD-10-CM | POA: Diagnosis not present

## 2024-10-31 DIAGNOSIS — O0993 Supervision of high risk pregnancy, unspecified, third trimester: Secondary | ICD-10-CM

## 2024-10-31 MED ORDER — METFORMIN HCL 500 MG PO TABS
1000.0000 mg | ORAL_TABLET | Freq: Every day | ORAL | 2 refills | Status: DC
  Filled 2024-10-31 – 2024-11-09 (×3): qty 30, 15d supply, fill #0
  Filled 2024-11-11: qty 90, 45d supply, fill #0
  Filled 2024-11-11: qty 30, 15d supply, fill #0

## 2024-10-31 NOTE — Patient Instructions (Signed)
Monique Fox, thank you for choosing our office today! We appreciate the opportunity to meet your healthcare needs. You may receive a short survey by mail, e-mail, or through MyChart. If you are happy with your care we would appreciate if you could take just a few minutes to complete the survey questions. We read all of your comments and take your feedback very seriously. Thank you again for choosing our office.  [facility] ([address] [place], [place] [address]) Entrance C, located off of [address] Free 24/7 valet parking   CLASSES: Go to [email or web address] to register for classes (childbirth, breastfeeding, waterbirth, infant CPR, daddy bootcamp, etc.)  Call the office ([phone]) or go to [facility] if: You begin to have strong, frequent contractions Your water breaks.  Sometimes it is a big gush of fluid, sometimes it is just a trickle that keeps getting your panties wet or running down your legs You have vaginal bleeding.  It is normal to have a small amount of spotting if your cervix was checked.  You don't feel your baby moving like normal.  If you don't, get you something to eat and drink and lay down and focus on feeling your baby move.   If your baby is still not moving like normal, you should call the office or go to [facility].  Call the office ([phone]) or go to [facility] for these signs of pre-eclampsia: Severe headache that does not go away with Tylenol Visual changes- seeing spots, double, blurred vision Pain under your right breast or upper abdomen that does not go away with Tums or heartburn medicine Nausea and/or vomiting Severe swelling in your hands, feet, and face   Tdap Vaccine It is recommended that you get the Tdap vaccine during the third trimester of EACH pregnancy to help protect your baby from getting pertussis (whooping cough) 27-36 weeks is the BEST time to do  this so that you can pass the protection on to your baby. During pregnancy is better than after pregnancy, but if you are unable to get it during pregnancy it will be offered at the hospital.  You can get this vaccine with us, at the health department, your family doctor, or some local pharmacies Everyone who will be around your baby should also be up-to-date on their vaccines before the baby comes. Adults (who are not pregnant) only need 1 dose of Tdap during adulthood.   [place] Pediatricians/Family Doctors [facility] ([facility]): [address], [phone]           [facility]: [address], [phone]                [facility] ([facility]): [address], [phone] (call to ask if accepting patients) [facility]: [address], [place], [phone]    [place] Pediatricians/Family Doctors [facility] ([facility]): [address], [phone] [facility]: [address], [phone] [facility]: [address], [phone]  [place] [facility]  [facility] ([facility]): [phone] [facility]: [address], [phone]   [place] Family Doctors [facility]: [address], [phone]  [place] [facility]  [facility]: [address], [phone]  Home Blood Pressure Monitoring for Patients   Your provider has recommended that you check your   blood pressure (BP) at least once a week at home. If you do not have a blood pressure cuff at home, one will be provided for you. Contact your provider if you have not received your monitor within 1 week.   Helpful Tips for Accurate Home Blood Pressure Checks  Don't smoke, exercise, or drink caffeine 30 minutes before checking your BP Use the restroom before checking your BP (a full bladder can raise your  pressure) Relax in a comfortable upright chair Feet on the ground Left arm resting comfortably on a flat surface at the level of your heart Legs uncrossed Back supported Sit quietly and don't talk Place the cuff on your bare arm Adjust snuggly, so that only two fingertips can fit between your skin and the top of the cuff Check 2 readings separated by at least one minute Keep a log of your BP readings For a visual, please reference this diagram: http://ccnc.care/bpdiagram  Provider Name: [facility]     Phone: [phone]  Zone 1: ALL CLEAR  Continue to monitor your symptoms:  BP reading is less than 140 (top number) or less than 90 (bottom number)  No right upper stomach pain No headaches or seeing spots No feeling nauseated or throwing up No swelling in face and hands  Zone 2: CAUTION Call your doctor's office for any of the following:  BP reading is greater than 140 (top number) or greater than 90 (bottom number)  Stomach pain under your ribs in the middle or right side Headaches or seeing spots Feeling nauseated or throwing up Swelling in face and hands  Zone 3: EMERGENCY  Seek immediate medical care if you have any of the following:  BP reading is greater than160 (top number) or greater than 110 (bottom number) Severe headaches not improving with Tylenol Serious difficulty catching your breath Any worsening symptoms from Zone 2  Preterm Labor and Birth Information  The normal length of a pregnancy is 39-41 weeks. Preterm labor is when labor starts before 37 completed weeks of pregnancy. What are the risk factors for preterm labor? Preterm labor is more likely to occur in women who: Have certain infections during pregnancy such as a bladder infection, sexually transmitted infection, or infection inside the uterus (chorioamnionitis). Have a shorter-than-normal cervix. Have gone into preterm labor before. Have had surgery on their cervix. Are younger than age 17  or older than age 35. Are African American. Are pregnant with twins or multiple babies (multiple gestation). Take street drugs or smoke while pregnant. Do not gain enough weight while pregnant. Became pregnant shortly after having been pregnant. What are the symptoms of preterm labor? Symptoms of preterm labor include: Cramps similar to those that can happen during a menstrual period. The cramps may happen with diarrhea. Pain in the abdomen or lower back. Regular uterine contractions that may feel like tightening of the abdomen. A feeling of increased pressure in the pelvis. Increased watery or bloody mucus discharge from the vagina. Water breaking (ruptured amniotic sac). Why is it important to recognize signs of preterm labor? It is important to recognize signs of preterm labor because babies who are born prematurely may not be fully developed. This can put them at an increased risk for: Long-term (chronic) heart and lung problems. Difficulty immediately after birth with regulating body systems, including blood sugar, body temperature, heart rate, and breathing rate. Bleeding in the brain. Cerebral palsy. Learning difficulties. Death. These risks are highest for babies who are born before 34 weeks   of pregnancy. How is preterm labor treated? Treatment depends on the length of your pregnancy, your condition, and the health of your baby. It may involve: Having a stitch (suture) placed in your cervix to prevent your cervix from opening too early (cerclage). Taking or being given medicines, such as: Hormone medicines. These may be given early in pregnancy to help support the pregnancy. Medicine to stop contractions. Medicines to help mature the baby's lungs. These may be prescribed if the risk of delivery is high. Medicines to prevent your baby from developing cerebral palsy. If the labor happens before 34 weeks of pregnancy, you may need to stay in the hospital. What should I do if I  think I am in preterm labor? If you think that you are going into preterm labor, call your health care provider right away. How can I prevent preterm labor in future pregnancies? To increase your chance of having a full-term pregnancy: Do not use any tobacco products, such as cigarettes, chewing tobacco, and e-cigarettes. If you need help quitting, ask your health care provider. Do not use street drugs or medicines that have not been prescribed to you during your pregnancy. Talk with your health care provider before taking any herbal supplements, even if you have been taking them regularly. Make sure you gain a healthy amount of weight during your pregnancy. Watch for infection. If you think that you might have an infection, get it checked right away. Make sure to tell your health care provider if you have gone into preterm labor before. This information is not intended to replace advice given to you by your health care provider. Make sure you discuss any questions you have with your health care provider. Document Revised: 02/11/2019 Document Reviewed: 03/12/2016 Elsevier Patient Education  2020 Elsevier Inc.   

## 2024-10-31 NOTE — Progress Notes (Signed)
 "  TELEHEALTH VIRTUAL OBSTETRICS VISIT ENCOUNTER NOTE Patient name: Tya Haughey MRN [id number]  Date of birth: 1996/04/28  I connected with patient on 10/31/2024 at  2:50 PM EST by MyChart video  and verified that I am speaking with the correct person using two identifiers. Pt is not currently in our office, she is at home.  The provider is in the office.    I discussed the limitations, risks, security and privacy concerns of performing an evaluation and management service by telephone and the availability of in person appointments. I also discussed with the patient that there may be a patient responsible charge related to this service. The patient expressed understanding and agreed to proceed.  Chief Complaint:   Routine Prenatal Visit  History of Present Illness:   Smrithi Pigford is a 28 y.o. G54P0101 female at [redacted]w[redacted]d with an Estimated Date of Delivery: 01/03/25 being evaluated today for ongoing management of a high-risk pregnancy complicated by A2DM, suspected LGA 92%, PGBMI 47.     06/21/2024    9:23 AM 08/04/2022    9:27 AM 01/23/2022   10:02 AM 02/24/2017    9:08 AM  Depression screen PHQ 2/9  Decreased Interest 0 0 0 0  Down, Depressed, Hopeless 0 0 0 0  PHQ - 2 Score 0 0 0 0  Altered sleeping 0 0 1   Tired, decreased energy 1 1 1    Change in appetite 0 0 1   Feeling bad or failure about yourself  0 0 1   Trouble concentrating 0 0 0   Moving slowly or fidgety/restless 0 0 0   Suicidal thoughts 0 0 0   PHQ-9 Score 1  1  4        Data saved with a previous flowsheet row definition    Today she reports fastings 111-127, all 2hr pp wnl. Dr. Jayne started metformin  500mg  at bedtime last Wednesday, has helped a little but still all elevated. At home visit today d/t cold sx.  Contractions: Not present. Vag. Bleeding: None.  Movement: Present. denies leaking of fluid. Review of Systems:   Pertinent items are noted in HPI Denies abnormal vaginal discharge w/ itching/odor/irritation,  headaches, visual changes, shortness of breath, chest pain, abdominal pain, severe nausea/vomiting, or problems with urination or bowel movements unless otherwise stated above. Pertinent History Reviewed:  Reviewed past medical,surgical, social, obstetrical and family history.  Reviewed problem list, medications and allergies. Physical Assessment:   Vitals:   10/31/24 1449  BP: 118/88  Pulse: 98  There is no height or weight on file to calculate BMI.        Physical Examination:   General:  Alert, oriented and cooperative.   Mental Status: Normal mood and affect perceived. Normal judgment and thought content.  Rest of physical exam deferred due to type of encounter  No results found for this or any previous visit (from the past 24 hours).  Assessment & Plan:  1) High Risk Pregnancy G2P0101 at [redacted]w[redacted]d with an Estimated Date of Delivery: 01/03/25   2) A2DM w/ suspected LGA 92% @ 28w> increase metformin  to 1000mg  at bedtime, add high protein snack at bedtime.   EFW q 4wks       2x/wk NST or weekly bpp @ 28-32wks    Deliver 37-38.6wks or as per MFM:_____   3) PGBMI 47  4) H/O 36w PTB d/t PPROM   Meds:  Meds ordered this encounter  Medications   metFORMIN  (GLUCOPHAGE ) 500 MG tablet  Sig: Take 2 tablets (1,000 mg total) by mouth at bedtime.    Dispense:  30 tablet    Refill:  2    Labs/procedures today: none  Plan:  Continue routine obstetrical care.  Does have home bp cuff. Office bp cuff given: not applicable. Check bp weekly, let us  know if consistently >140 and/or >90.  Next visit: prefers in person    Reviewed: Preterm labor symptoms and general obstetric precautions including but not limited to vaginal bleeding, contractions, leaking of fluid and fetal movement were reviewed in detail with the patient. The patient was advised to call back or seek an in-person office evaluation/go to [facility] at [facility] for any urgent or concerning symptoms. All questions were  answered. Please refer to After Visit Summary for other counseling recommendations.    I provided 10 minutes of non-face-to-face time during this encounter.  Follow-up: Return for weekly bpp and HROB w/ MD/CNM.  Orders Placed This Encounter  Procedures   US  FETAL BPP WO NON STRESS   [name] CNM, [facility] 10/31/2024 3:43 PM   "

## 2024-11-09 ENCOUNTER — Other Ambulatory Visit (HOSPITAL_COMMUNITY): Payer: Self-pay

## 2024-11-09 ENCOUNTER — Encounter: Payer: Self-pay | Admitting: Advanced Practice Midwife

## 2024-11-09 ENCOUNTER — Other Ambulatory Visit: Payer: Self-pay

## 2024-11-09 ENCOUNTER — Other Ambulatory Visit: Payer: Self-pay | Admitting: Women's Health

## 2024-11-09 ENCOUNTER — Ambulatory Visit: Admitting: Advanced Practice Midwife

## 2024-11-09 ENCOUNTER — Encounter (HOSPITAL_COMMUNITY): Payer: Self-pay

## 2024-11-09 ENCOUNTER — Ambulatory Visit: Admitting: Radiology

## 2024-11-09 VITALS — BP 121/85 | HR 98 | Wt 265.0 lb

## 2024-11-09 DIAGNOSIS — O09213 Supervision of pregnancy with history of pre-term labor, third trimester: Secondary | ICD-10-CM | POA: Diagnosis not present

## 2024-11-09 DIAGNOSIS — O0992 Supervision of high risk pregnancy, unspecified, second trimester: Secondary | ICD-10-CM

## 2024-11-09 DIAGNOSIS — O3663X0 Maternal care for excessive fetal growth, third trimester, not applicable or unspecified: Secondary | ICD-10-CM | POA: Diagnosis not present

## 2024-11-09 DIAGNOSIS — Z6841 Body Mass Index (BMI) 40.0 and over, adult: Secondary | ICD-10-CM

## 2024-11-09 DIAGNOSIS — Z331 Pregnant state, incidental: Secondary | ICD-10-CM

## 2024-11-09 DIAGNOSIS — O24419 Gestational diabetes mellitus in pregnancy, unspecified control: Secondary | ICD-10-CM

## 2024-11-09 DIAGNOSIS — Z2911 Encounter for prophylactic immunotherapy for respiratory syncytial virus (RSV): Secondary | ICD-10-CM | POA: Diagnosis not present

## 2024-11-09 DIAGNOSIS — Z3A32 32 weeks gestation of pregnancy: Secondary | ICD-10-CM

## 2024-11-09 DIAGNOSIS — O24415 Gestational diabetes mellitus in pregnancy, controlled by oral hypoglycemic drugs: Secondary | ICD-10-CM

## 2024-11-09 DIAGNOSIS — Z9229 Personal history of other drug therapy: Secondary | ICD-10-CM

## 2024-11-09 DIAGNOSIS — Z3A34 34 weeks gestation of pregnancy: Secondary | ICD-10-CM

## 2024-11-09 DIAGNOSIS — O0990 Supervision of high risk pregnancy, unspecified, unspecified trimester: Secondary | ICD-10-CM

## 2024-11-09 DIAGNOSIS — Z1389 Encounter for screening for other disorder: Secondary | ICD-10-CM

## 2024-11-09 DIAGNOSIS — Z23 Encounter for immunization: Secondary | ICD-10-CM | POA: Diagnosis not present

## 2024-11-09 LAB — POCT URINALYSIS DIPSTICK OB
Glucose, UA: NEGATIVE
Nitrite, UA: NEGATIVE

## 2024-11-09 MED ORDER — GLYBURIDE 2.5 MG PO TABS
2.5000 mg | ORAL_TABLET | Freq: Every day | ORAL | 3 refills | Status: DC
  Filled 2024-11-09: qty 90, 90d supply, fill #0

## 2024-11-09 NOTE — Progress Notes (Signed)
 US : GA = 32+1 weeks Single active fetus, cephalic, FHR = 159 bpm, fundal posterior pl, gr2, AFI = 10.1 cm, MVP = 3.7 cm, BPP 8/8, EFW 2471g, 97%, AC 99%

## 2024-11-09 NOTE — Progress Notes (Signed)
 "  HIGH-RISK PREGNANCY VISIT Patient name: Mckinleigh Schuchart MRN [id number]  Date of birth: [birth year] Chief Complaint:   Routine Prenatal Visit and Pregnancy Ultrasound  History of Present Illness:   Oneal Biglow is a 29 y.o. G77P0101 female at [redacted]w[redacted]d with an Estimated Date of Delivery: 01/03/25 being seen today for ongoing management of a high-risk pregnancy complicated by diabetes mellitus A2DM currently on MTF 1gm q hs.    Today she reports no complaints. Contractions: Not present.  .  Movement: Present. denies leaking of fluid.      06/21/2024    9:23 AM 08/04/2022    9:27 AM 01/23/2022   10:02 AM 02/24/2017    9:08 AM  Depression screen PHQ 2/9  Decreased Interest 0 0 0 0  Down, Depressed, Hopeless 0 0 0 0  PHQ - 2 Score 0 0 0 0  Altered sleeping 0 0 1   Tired, decreased energy 1 1 1    Change in appetite 0 0 1   Feeling bad or failure about yourself  0 0 1   Trouble concentrating 0 0 0   Moving slowly or fidgety/restless 0 0 0   Suicidal thoughts 0 0 0   PHQ-9 Score 1  1  4        Data saved with a previous flowsheet row definition        06/21/2024    9:23 AM 08/04/2022    9:28 AM 01/23/2022   10:03 AM  GAD 7 : Generalized Anxiety Score  Nervous, Anxious, on Edge 0 0 2  Control/stop worrying 0 0 2  Worry too much - different things 0 0 2  Trouble relaxing 0 0 2  Restless 0 0 1  Easily annoyed or irritable 0 0 1  Afraid - awful might happen 0 0 0  Total GAD 7 Score 0 0 10     Review of Systems:   Pertinent items are noted in HPI Denies abnormal vaginal discharge w/ itching/odor/irritation, headaches, visual changes, shortness of breath, chest pain, abdominal pain, severe nausea/vomiting, or problems with urination or bowel movements unless otherwise stated above. Pertinent History Reviewed:  Reviewed past medical,surgical, social, obstetrical and family history.  Reviewed problem list, medications and allergies. Physical Assessment:   Vitals:   11/09/24 0948  BP:  121/85  Pulse: 98  Weight: 265 lb (120.2 kg)  Body mass index is 46.94 kg/m.           Physical Examination:   General appearance: alert, well appearing, and in no distress  Mental status: alert, oriented to person, place, and time  Skin: warm & dry   Extremities: Edema: None    Cardiovascular: normal heart rate noted  Respiratory: normal respiratory effort, no distress  Abdomen: gravid, soft, non-tender  Pelvic: Cervical exam deferred         Fetal Status: Fetal Heart Rate (bpm): 159 u/s   Movement: Present    Fetal Surveillance Testing today: US : GA = 32+1 weeks Single active fetus, cephalic, FHR = 159 bpm, fundal posterior pl, gr2, AFI = 10.1 cm, MVP = 3.7 cm, BPP 8/8, EFW 2471g, 97%, AC 99%    Results for orders placed or performed in visit on 11/09/24 (from the past 24 hours)  POC Urinalysis Dipstick OB   Collection Time: 11/09/24  9:38 AM  Result Value Ref Range   Color, UA     Clarity, UA     Glucose, UA Negative Negative   Bilirubin, UA  Ketones, UA trace    Spec Grav, UA     Blood, UA trace    pH, UA     POC,PROTEIN,UA Small (1+) Negative, Trace, Small (1+), Moderate (2+), Large (3+), 4+   Urobilinogen, UA     Nitrite, UA negative    Leukocytes, UA Trace (A) Negative   Appearance     Odor      Assessment & Plan:  High-risk pregnancy: G2P0101 at [redacted]w[redacted]d with an Estimated Date of Delivery: 01/03/25   1) A2DM w suspected LGA 97% (AC 99%) today, increased MTF to 1gm at bedtime with a high protein snack on 12/29 and since then her fasting values have been between 103-133; all PP values during the day are <120; per Dr Jayne, add in Glyburide  2.5 q hs (also discussed taking both meds at bedtime and not at dinner)  2) PG BMI 47  3) Hx 36wk delivery after PPROM  Meds: No orders of the defined types were placed in this encounter.   Labs/procedures today: Tdap, RSV vaccine, and U/S  Treatment Plan:  Growth u/s @ 20, 28, 32, 36wks     2x/wk testing @ 32wks or weekly  BPP    Deliver @ 39wks:______   Reviewed: Preterm labor symptoms and general obstetric precautions including but not limited to vaginal bleeding, contractions, leaking of fluid and fetal movement were reviewed in detail with the patient.  All questions were answered. Does have home bp cuff. Office bp cuff given: not applicable. Check bp weekly, let us  know if consistently >140 and/or >90.  Follow-up: No follow-ups on file.   Future Appointments  Date Time Provider Department Center  11/16/2024  1:30 PM [facility] - [facility] US  [facility] None  11/16/2024  2:30 PM Jayne Vonn DEL, MD [facility]  11/23/2024  9:15 AM [facility] US  [facility] None  11/23/2024 10:10 AM Kizzie Suzen SAUNDERS, CNM [facility]  12/01/2024 10:45 AM [facility] US  [facility] None  12/01/2024 11:30 AM Kizzie Suzen SAUNDERS, CNM [facility]  12/08/2024  8:30 AM [facility] US  [facility] None  12/08/2024  9:30 AM Kizzie Suzen SAUNDERS, CNM [facility]  12/14/2024  8:30 AM [facility] US  [facility] None  12/14/2024  9:30 AM Kizzie Suzen SAUNDERS, CNM [facility]  12/21/2024  8:30 AM [facility] 2 [facility] None  12/21/2024  9:30 AM Loreli Suzen BIRCH, CNM [facility]  12/28/2024  8:30 AM [facility] US  [facility] None  12/28/2024  9:30 AM Marilynn Nest, DO [facility]    Orders Placed This Encounter  Procedures   Tdap vaccine greater than or equal to 7yo IM   Respiratory syncytial virus vaccine, preF, subunit, bivalent,(Abrysvo)   POC Urinalysis Dipstick OB   Suzen BIRCH Loreli [facility] 11/09/2024 10:34 AM  "

## 2024-11-10 ENCOUNTER — Other Ambulatory Visit (HOSPITAL_COMMUNITY): Payer: Self-pay

## 2024-11-10 ENCOUNTER — Encounter (HOSPITAL_COMMUNITY): Payer: Self-pay | Admitting: Pharmacist

## 2024-11-11 ENCOUNTER — Other Ambulatory Visit (HOSPITAL_COMMUNITY): Payer: Self-pay

## 2024-11-11 ENCOUNTER — Encounter: Payer: Self-pay | Admitting: Women's Health

## 2024-11-11 ENCOUNTER — Other Ambulatory Visit: Payer: Self-pay

## 2024-11-16 ENCOUNTER — Ambulatory Visit

## 2024-11-16 ENCOUNTER — Encounter: Payer: Self-pay | Admitting: Obstetrics & Gynecology

## 2024-11-16 ENCOUNTER — Ambulatory Visit: Admitting: Obstetrics & Gynecology

## 2024-11-16 VITALS — BP 128/84 | HR 108 | Wt 272.0 lb

## 2024-11-16 DIAGNOSIS — Z6841 Body Mass Index (BMI) 40.0 and over, adult: Secondary | ICD-10-CM

## 2024-11-16 DIAGNOSIS — Z3A33 33 weeks gestation of pregnancy: Secondary | ICD-10-CM

## 2024-11-16 DIAGNOSIS — O0993 Supervision of high risk pregnancy, unspecified, third trimester: Secondary | ICD-10-CM

## 2024-11-16 DIAGNOSIS — O0990 Supervision of high risk pregnancy, unspecified, unspecified trimester: Secondary | ICD-10-CM

## 2024-11-16 DIAGNOSIS — O24419 Gestational diabetes mellitus in pregnancy, unspecified control: Secondary | ICD-10-CM | POA: Diagnosis not present

## 2024-11-16 DIAGNOSIS — O09213 Supervision of pregnancy with history of pre-term labor, third trimester: Secondary | ICD-10-CM

## 2024-11-16 DIAGNOSIS — O24415 Gestational diabetes mellitus in pregnancy, controlled by oral hypoglycemic drugs: Secondary | ICD-10-CM | POA: Diagnosis not present

## 2024-11-16 NOTE — Progress Notes (Signed)
 "   HIGH-RISK PREGNANCY VISIT Patient name: Monique Fox MRN [id number]  Date of birth: 1996/01/16 Chief Complaint:   Routine Prenatal Visit  History of Present Illness:   Monique Fox is a 29 y.o. G38P0101 female at [redacted]w[redacted]d with an Estimated Date of Delivery: 01/03/25 being seen today for ongoing management of a high-risk pregnancy complicated by     ICD-10-CM   1. Supervision of high risk pregnancy, antepartum  O09.90     2. Gestational diabetes mellitus, class A2: Metformin  1000 mg + glyburide  2.5 mg qhs, EFW 97%, IOL 37th week  O24.419     3. BMI 45.0-49.9, adult (HCC)  Z68.42      .    Today she reports no complaints. Contractions: Not present. Vag. Bleeding: None.  Movement: Present. denies leaking of fluid.      06/21/2024    9:23 AM 08/04/2022    9:27 AM 01/23/2022   10:02 AM 02/24/2017    9:08 AM  Depression screen PHQ 2/9  Decreased Interest 0 0 0 0  Down, Depressed, Hopeless 0 0 0 0  PHQ - 2 Score 0 0 0 0  Altered sleeping 0 0 1   Tired, decreased energy 1 1 1    Change in appetite 0 0 1   Feeling bad or failure about yourself  0 0 1   Trouble concentrating 0 0 0   Moving slowly or fidgety/restless 0 0 0   Suicidal thoughts 0 0 0   PHQ-9 Score 1  1  4        Data saved with a previous flowsheet row definition        06/21/2024    9:23 AM 08/04/2022    9:28 AM 01/23/2022   10:03 AM  GAD 7 : Generalized Anxiety Score  Nervous, Anxious, on Edge 0 0 2  Control/stop worrying 0 0 2  Worry too much - different things 0 0 2  Trouble relaxing 0 0 2  Restless 0 0 1  Easily annoyed or irritable 0 0 1  Afraid - awful might happen 0 0 0  Total GAD 7 Score 0 0 10     Review of Systems:   Pertinent items are noted in HPI Denies abnormal vaginal discharge w/ itching/odor/irritation, headaches, visual changes, shortness of breath, chest pain, abdominal pain, severe nausea/vomiting, or problems with urination or bowel movements unless otherwise stated above. Pertinent  History Reviewed:  Reviewed past medical,surgical, social, obstetrical and family history.  Reviewed problem list, medications and allergies. Physical Assessment:   Vitals:   11/16/24 1425  BP: 128/84  Pulse: (!) 108  Weight: 272 lb (123.4 kg)  Body mass index is 48.18 kg/m.           Physical Examination:   General appearance: alert, well appearing, and in no distress  Mental status: alert, oriented to person, place, and time  Skin: warm & dry   Extremities:      Cardiovascular: normal heart rate noted  Respiratory: normal respiratory effort, no distress  Abdomen: gravid, soft, non-tender  Pelvic: Cervical exam deferred         Fetal Status:     Movement: Present    Fetal Surveillance Testing today: BPP 8/8   Chaperone:     No results found for this or any previous visit (from the past 24 hours).  Assessment & Plan:  High-risk pregnancy: G2P0101 at [redacted]w[redacted]d with an Estimated Date of Delivery: 01/03/25      ICD-10-CM   1.  Supervision of high risk pregnancy, antepartum  O09.90     2. Gestational diabetes mellitus, class A2: Metformin  1000 mg + glyburide  2.5 mg qhs, EFW 97%, IOL 37th week  O24.419     3. BMI 45.0-49.9, adult (HCC)  Z68.42          Meds: No orders of the defined types were placed in this encounter.   Orders: No orders of the defined types were placed in this encounter.    Labs/procedures today: U/S  Treatment Plan:  if stays the same IOL 37th week, continue with current management surveillance plan    Follow-up: Return for keep scheduled.   Future Appointments  Date Time Provider Department Center  11/23/2024  9:15 AM [facility] - [facility] US  [facility] None  11/23/2024 10:10 AM Kizzie Suzen SAUNDERS, CNM [facility]  12/01/2024 10:45 AM [facility] - [facility] US  [facility] None  12/01/2024 11:30 AM Kizzie Suzen SAUNDERS, CNM [facility]  12/08/2024  8:30 AM [facility] - [facility] US  [facility] None  12/08/2024  9:30 AM Kizzie Suzen SAUNDERS, CNM [facility]  12/14/2024  8:30  AM [facility] - [facility] US  [facility] None  12/14/2024  9:30 AM Kizzie Suzen SAUNDERS, CNM [facility]  12/21/2024  8:30 AM [facility] FT IMG 2 [facility] None  12/21/2024  9:30 AM Loreli Suzen BIRCH, CNM [facility]  12/28/2024  8:30 AM [facility] - [facility] US  [facility] None  12/28/2024  9:30 AM Marilynn Nest, DO [facility]    No orders of the defined types were placed in this encounter.  Vonn VEAR Inch  Attending Physician for the Center for [facility] 11/16/2024 3:03 PM  "

## 2024-11-16 NOTE — Progress Notes (Signed)
 US  33+1 wks,cephalic,BPP 8/8,posterior placenta gr 1,AFI 13 cm,FHR 140 bpm

## 2024-11-22 ENCOUNTER — Encounter: Payer: Self-pay | Admitting: *Deleted

## 2024-11-22 DIAGNOSIS — O24419 Gestational diabetes mellitus in pregnancy, unspecified control: Secondary | ICD-10-CM | POA: Insufficient documentation

## 2024-11-23 ENCOUNTER — Other Ambulatory Visit (HOSPITAL_COMMUNITY): Payer: Self-pay

## 2024-11-23 ENCOUNTER — Ambulatory Visit: Admitting: Women's Health

## 2024-11-23 ENCOUNTER — Other Ambulatory Visit: Payer: Self-pay

## 2024-11-23 ENCOUNTER — Encounter: Payer: Self-pay | Admitting: Women's Health

## 2024-11-23 ENCOUNTER — Other Ambulatory Visit

## 2024-11-23 ENCOUNTER — Encounter (HOSPITAL_COMMUNITY): Payer: Self-pay

## 2024-11-23 VITALS — BP 121/77 | HR 109 | Wt 272.0 lb

## 2024-11-23 DIAGNOSIS — O24419 Gestational diabetes mellitus in pregnancy, unspecified control: Secondary | ICD-10-CM

## 2024-11-23 DIAGNOSIS — Z3A34 34 weeks gestation of pregnancy: Secondary | ICD-10-CM

## 2024-11-23 DIAGNOSIS — O24415 Gestational diabetes mellitus in pregnancy, controlled by oral hypoglycemic drugs: Secondary | ICD-10-CM | POA: Diagnosis not present

## 2024-11-23 DIAGNOSIS — O09213 Supervision of pregnancy with history of pre-term labor, third trimester: Secondary | ICD-10-CM

## 2024-11-23 DIAGNOSIS — Z6841 Body Mass Index (BMI) 40.0 and over, adult: Secondary | ICD-10-CM

## 2024-11-23 DIAGNOSIS — O0990 Supervision of high risk pregnancy, unspecified, unspecified trimester: Secondary | ICD-10-CM

## 2024-11-23 DIAGNOSIS — O0993 Supervision of high risk pregnancy, unspecified, third trimester: Secondary | ICD-10-CM

## 2024-11-23 LAB — POCT URINALYSIS DIPSTICK OB
Blood, UA: NEGATIVE
Glucose, UA: NEGATIVE
Nitrite, UA: NEGATIVE

## 2024-11-23 MED ORDER — INSULIN NPH (HUMAN) (ISOPHANE) 100 UNIT/ML ~~LOC~~ SUSP
10.0000 [IU] | Freq: Every day | SUBCUTANEOUS | 0 refills | Status: AC
  Filled 2024-11-23: qty 10, 28d supply, fill #0

## 2024-11-23 NOTE — Progress Notes (Signed)
 US  34+1 wks,cephalic,BPP 8/8,FHR 155 bpm,posterior placenta gr 1,AFI 13 cm,limited view

## 2024-11-23 NOTE — Patient Instructions (Signed)
Monique Fox, thank you for choosing our office today! We appreciate the opportunity to meet your healthcare needs. You may receive a short survey by mail, e-mail, or through MyChart. If you are happy with your care we would appreciate if you could take just a few minutes to complete the survey questions. We read all of your comments and take your feedback very seriously. Thank you again for choosing our office.  [facility] ([address] [place], [place] [address]) Entrance C, located off of [address] Free 24/7 valet parking   CLASSES: Go to [email or web address] to register for classes (childbirth, breastfeeding, waterbirth, infant CPR, daddy bootcamp, etc.)  Call the office ([phone]) or go to [facility] if: You begin to have strong, frequent contractions Your water breaks.  Sometimes it is a big gush of fluid, sometimes it is just a trickle that keeps getting your panties wet or running down your legs You have vaginal bleeding.  It is normal to have a small amount of spotting if your cervix was checked.  You don't feel your baby moving like normal.  If you don't, get you something to eat and drink and lay down and focus on feeling your baby move.   If your baby is still not moving like normal, you should call the office or go to [facility].  Call the office ([phone]) or go to [facility] for these signs of pre-eclampsia: Severe headache that does not go away with Tylenol Visual changes- seeing spots, double, blurred vision Pain under your right breast or upper abdomen that does not go away with Tums or heartburn medicine Nausea and/or vomiting Severe swelling in your hands, feet, and face   Tdap Vaccine It is recommended that you get the Tdap vaccine during the third trimester of EACH pregnancy to help protect your baby from getting pertussis (whooping cough) 27-36 weeks is the BEST time to do  this so that you can pass the protection on to your baby. During pregnancy is better than after pregnancy, but if you are unable to get it during pregnancy it will be offered at the hospital.  You can get this vaccine with us, at the health department, your family doctor, or some local pharmacies Everyone who will be around your baby should also be up-to-date on their vaccines before the baby comes. Adults (who are not pregnant) only need 1 dose of Tdap during adulthood.   [place] Pediatricians/Family Doctors [facility] ([facility]): [address], [phone]           [facility]: [address], [phone]                [facility] ([facility]): [address], [phone] (call to ask if accepting patients) [facility]: [address], [place], [phone]    [place] Pediatricians/Family Doctors [facility] ([facility]): [address], [phone] [facility]: [address], [phone] [facility]: [address], [phone]  [place] [facility]  [facility] ([facility]): [phone] [facility]: [address], [phone]   [place] Family Doctors [facility]: [address], [phone]  [place] [facility]  [facility]: [address], [phone]  Home Blood Pressure Monitoring for Patients   Your provider has recommended that you check your   blood pressure (BP) at least once a week at home. If you do not have a blood pressure cuff at home, one will be provided for you. Contact your provider if you have not received your monitor within 1 week.   Helpful Tips for Accurate Home Blood Pressure Checks  Don't smoke, exercise, or drink caffeine 30 minutes before checking your BP Use the restroom before checking your BP (a full bladder can raise your  pressure) Relax in a comfortable upright chair Feet on the ground Left arm resting comfortably on a flat surface at the level of your heart Legs uncrossed Back supported Sit quietly and don't talk Place the cuff on your bare arm Adjust snuggly, so that only two fingertips can fit between your skin and the top of the cuff Check 2 readings separated by at least one minute Keep a log of your BP readings For a visual, please reference this diagram: http://ccnc.care/bpdiagram  Provider Name: [facility]     Phone: [phone]  Zone 1: ALL CLEAR  Continue to monitor your symptoms:  BP reading is less than 140 (top number) or less than 90 (bottom number)  No right upper stomach pain No headaches or seeing spots No feeling nauseated or throwing up No swelling in face and hands  Zone 2: CAUTION Call your doctor's office for any of the following:  BP reading is greater than 140 (top number) or greater than 90 (bottom number)  Stomach pain under your ribs in the middle or right side Headaches or seeing spots Feeling nauseated or throwing up Swelling in face and hands  Zone 3: EMERGENCY  Seek immediate medical care if you have any of the following:  BP reading is greater than160 (top number) or greater than 110 (bottom number) Severe headaches not improving with Tylenol Serious difficulty catching your breath Any worsening symptoms from Zone 2  Preterm Labor and Birth Information  The normal length of a pregnancy is 39-41 weeks. Preterm labor is when labor starts before 37 completed weeks of pregnancy. What are the risk factors for preterm labor? Preterm labor is more likely to occur in women who: Have certain infections during pregnancy such as a bladder infection, sexually transmitted infection, or infection inside the uterus (chorioamnionitis). Have a shorter-than-normal cervix. Have gone into preterm labor before. Have had surgery on their cervix. Are younger than age 17  or older than age 35. Are African American. Are pregnant with twins or multiple babies (multiple gestation). Take street drugs or smoke while pregnant. Do not gain enough weight while pregnant. Became pregnant shortly after having been pregnant. What are the symptoms of preterm labor? Symptoms of preterm labor include: Cramps similar to those that can happen during a menstrual period. The cramps may happen with diarrhea. Pain in the abdomen or lower back. Regular uterine contractions that may feel like tightening of the abdomen. A feeling of increased pressure in the pelvis. Increased watery or bloody mucus discharge from the vagina. Water breaking (ruptured amniotic sac). Why is it important to recognize signs of preterm labor? It is important to recognize signs of preterm labor because babies who are born prematurely may not be fully developed. This can put them at an increased risk for: Long-term (chronic) heart and lung problems. Difficulty immediately after birth with regulating body systems, including blood sugar, body temperature, heart rate, and breathing rate. Bleeding in the brain. Cerebral palsy. Learning difficulties. Death. These risks are highest for babies who are born before 34 weeks   of pregnancy. How is preterm labor treated? Treatment depends on the length of your pregnancy, your condition, and the health of your baby. It may involve: Having a stitch (suture) placed in your cervix to prevent your cervix from opening too early (cerclage). Taking or being given medicines, such as: Hormone medicines. These may be given early in pregnancy to help support the pregnancy. Medicine to stop contractions. Medicines to help mature the baby's lungs. These may be prescribed if the risk of delivery is high. Medicines to prevent your baby from developing cerebral palsy. If the labor happens before 34 weeks of pregnancy, you may need to stay in the hospital. What should I do if I  think I am in preterm labor? If you think that you are going into preterm labor, call your health care provider right away. How can I prevent preterm labor in future pregnancies? To increase your chance of having a full-term pregnancy: Do not use any tobacco products, such as cigarettes, chewing tobacco, and e-cigarettes. If you need help quitting, ask your health care provider. Do not use street drugs or medicines that have not been prescribed to you during your pregnancy. Talk with your health care provider before taking any herbal supplements, even if you have been taking them regularly. Make sure you gain a healthy amount of weight during your pregnancy. Watch for infection. If you think that you might have an infection, get it checked right away. Make sure to tell your health care provider if you have gone into preterm labor before. This information is not intended to replace advice given to you by your health care provider. Make sure you discuss any questions you have with your health care provider. Document Revised: 02/11/2019 Document Reviewed: 03/12/2016 Elsevier Patient Education  2020 Elsevier Inc.   

## 2024-11-23 NOTE — Progress Notes (Addendum)
 "  HIGH-RISK PREGNANCY VISIT Patient name: Monique Fox MRN [id number]  Date of birth: 1996-08-23 Chief Complaint:   Routine Prenatal Visit  History of Present Illness:   Monique Fox is a 29 y.o. G65P0101 female at [redacted]w[redacted]d with an Estimated Date of Delivery: 01/03/25 being seen today for ongoing management of a high-risk pregnancy complicated by diabetes mellitus A2DM currently on metformin  1000mg  at bedtime and glyburide  2.5mg  at bedtime, PGBMI 47.    Today she did bring log, reports fastings 99-103, all 2hr pp wnl. Glyburide  is causing diarrhea/vomiting nightly. Doing good w/ diet. Contractions: Not present. Vag. Bleeding: None.  Movement: Present. denies leaking of fluid.      06/21/2024    9:23 AM 08/04/2022    9:27 AM 01/23/2022   10:02 AM 02/24/2017    9:08 AM  Depression screen PHQ 2/9  Decreased Interest 0 0 0 0  Down, Depressed, Hopeless 0 0 0 0  PHQ - 2 Score 0 0 0 0  Altered sleeping 0 0 1   Tired, decreased energy 1 1 1    Change in appetite 0 0 1   Feeling bad or failure about yourself  0 0 1   Trouble concentrating 0 0 0   Moving slowly or fidgety/restless 0 0 0   Suicidal thoughts 0 0 0   PHQ-9 Score 1  1  4        Data saved with a previous flowsheet row definition        06/21/2024    9:23 AM 08/04/2022    9:28 AM 01/23/2022   10:03 AM  GAD 7 : Generalized Anxiety Score  Nervous, Anxious, on Edge 0  0  2   Control/stop worrying 0  0  2   Worry too much - different things 0  0  2   Trouble relaxing 0  0  2   Restless 0  0  1   Easily annoyed or irritable 0  0  1   Afraid - awful might happen 0  0  0   Total GAD 7 Score 0 0 10     Data saved with a previous flowsheet row definition     Review of Systems:   Pertinent items are noted in HPI Denies abnormal vaginal discharge w/ itching/odor/irritation, headaches, visual changes, shortness of breath, chest pain, abdominal pain, severe nausea/vomiting, or problems with urination or bowel movements unless otherwise  stated above. Pertinent History Reviewed:  Reviewed past medical,surgical, social, obstetrical and family history.  Reviewed problem list, medications and allergies. Physical Assessment:   Vitals:   11/23/24 0954  BP: 121/77  Pulse: (!) 109  Weight: 272 lb (123.4 kg)  Body mass index is 48.18 kg/m.           Physical Examination:   General appearance: alert, well appearing, and in no distress  Mental status: alert, oriented to person, place, and time  Skin: warm & dry   Extremities:      Cardiovascular: normal heart rate noted  Respiratory: normal respiratory effort, no distress  Abdomen: gravid, soft, non-tender  Pelvic: Cervical exam deferred         Fetal Status:     Movement: Present    Fetal Surveillance Testing today: US  34+1 wks,cephalic,BPP 8/8,FHR 155 bpm,posterior placenta gr 1,AFI 13 cm,limited view   Chaperone: N/A  Results for orders placed or performed in visit on 11/23/24 (from the past 24 hours)  POC Urinalysis Dipstick OB   Collection Time: 11/23/24  9:58 AM  Result Value Ref Range   Color, UA     Clarity, UA     Glucose, UA Negative Negative   Bilirubin, UA     Ketones, UA 4+    Spec Grav, UA     Blood, UA Neg    pH, UA     POC,PROTEIN,UA Trace Negative, Trace, Small (1+), Moderate (2+), Large (3+), 4+   Urobilinogen, UA     Nitrite, UA Neg    Leukocytes, UA Trace (A) Negative   Appearance     Odor      Assessment & Plan:  High-risk pregnancy: G2P0101 at [redacted]w[redacted]d with an Estimated Date of Delivery: 01/03/25   1) A2DM, EFW 97% @ 32w, today bpp 8/8, AFI 13. Fastings still high, discussed w/ Dr. Jayne, stop glyburide  (d/t side effects), start NPH 10u at bedtime (is a engineer, civil (consulting), declines education about self-injections). If notices lows let us  know. IOL scheduled for 2/10 @[place] ,  IOL form faxed and orders placed    2) PGBMI 47, currently 48  3) H/O 36wk PTB d/t PPROM  Meds:  Meds ordered this encounter  Medications   insulin  NPH Human (NOVOLIN N) 100  UNIT/ML injection    Sig: Inject 0.1 mLs (10 Units total) into the skin at bedtime.    Dispense:  10 mL    Refill:  0   Labs/procedures today: U/S  Treatment Plan:  EFW q 4wks       2x/wk NST or weekly bpp     Deliver 37-38.0wks or as per MFM:_____   Reviewed: Preterm labor symptoms and general obstetric precautions including but not limited to vaginal bleeding, contractions, leaking of fluid and fetal movement were reviewed in detail with the patient.  All questions were answered. Does have home bp cuff. Office bp cuff given: not applicable. Check bp daily, let us  know if consistently >140 and/or >90.  Follow-up: Return for md rest of pregnancy, delivering at 37w.   Future Appointments  Date Time Provider Department Center  12/01/2024 10:45 AM [facility] - [facility] US  [facility] None  12/01/2024 11:30 AM Kizzie Suzen SAUNDERS, CNM [facility]  12/08/2024  8:30 AM [facility] - [facility] US  [facility] None  12/08/2024  9:30 AM Kizzie Suzen SAUNDERS, CNM [facility]  12/13/2024 12:00 AM [facility] SCHED ROOM [facility] None  12/14/2024  8:30 AM [facility] - [facility] US  [facility] None  12/14/2024  9:30 AM Kizzie Suzen SAUNDERS, CNM [facility]  12/21/2024  8:30 AM [facility] - FT IMG 2 [facility] None  12/21/2024  9:30 AM Loreli Suzen BIRCH, CNM [facility]  12/28/2024  8:30 AM [facility] - [facility] US  [facility] None  12/28/2024  9:30 AM Marilynn Nest, DO [facility]    Orders Placed This Encounter  Procedures   POC Urinalysis Dipstick OB   Suzen SAUNDERS Kizzie Hobart, [facility] 11/23/2024 4:39 PM  "

## 2024-11-23 NOTE — Addendum Note (Signed)
 Addended by: KIZZIE SUZEN SAUNDERS on: 11/23/2024 04:39 PM   Modules accepted: Orders

## 2024-11-27 ENCOUNTER — Encounter: Payer: Self-pay | Admitting: Women's Health

## 2024-11-28 ENCOUNTER — Other Ambulatory Visit (HOSPITAL_COMMUNITY): Payer: Self-pay

## 2024-12-01 ENCOUNTER — Other Ambulatory Visit (HOSPITAL_COMMUNITY): Payer: Self-pay

## 2024-12-01 ENCOUNTER — Ambulatory Visit: Admitting: Women's Health

## 2024-12-01 ENCOUNTER — Other Ambulatory Visit (HOSPITAL_COMMUNITY)
Admission: RE | Admit: 2024-12-01 | Discharge: 2024-12-01 | Disposition: A | Source: Ambulatory Visit | Attending: Women's Health | Admitting: Women's Health

## 2024-12-01 ENCOUNTER — Ambulatory Visit (INDEPENDENT_AMBULATORY_CARE_PROVIDER_SITE_OTHER)

## 2024-12-01 VITALS — BP 126/84 | HR 91 | Wt 271.2 lb

## 2024-12-01 DIAGNOSIS — O0990 Supervision of high risk pregnancy, unspecified, unspecified trimester: Secondary | ICD-10-CM

## 2024-12-01 DIAGNOSIS — Z3A35 35 weeks gestation of pregnancy: Secondary | ICD-10-CM | POA: Insufficient documentation

## 2024-12-01 DIAGNOSIS — O0993 Supervision of high risk pregnancy, unspecified, third trimester: Secondary | ICD-10-CM

## 2024-12-01 DIAGNOSIS — Z6841 Body Mass Index (BMI) 40.0 and over, adult: Secondary | ICD-10-CM

## 2024-12-01 DIAGNOSIS — O09213 Supervision of pregnancy with history of pre-term labor, third trimester: Secondary | ICD-10-CM

## 2024-12-01 DIAGNOSIS — O24419 Gestational diabetes mellitus in pregnancy, unspecified control: Secondary | ICD-10-CM | POA: Diagnosis not present

## 2024-12-01 DIAGNOSIS — O24415 Gestational diabetes mellitus in pregnancy, controlled by oral hypoglycemic drugs: Secondary | ICD-10-CM | POA: Diagnosis not present

## 2024-12-01 LAB — POCT URINALYSIS DIPSTICK OB
Blood, UA: NEGATIVE
Glucose, UA: NEGATIVE
Ketones, UA: NEGATIVE
Nitrite, UA: NEGATIVE
POC,PROTEIN,UA: NEGATIVE

## 2024-12-01 NOTE — Patient Instructions (Signed)
Monique Fox, thank you for choosing our office today! We appreciate the opportunity to meet your healthcare needs. You may receive a short survey by mail, e-mail, or through MyChart. If you are happy with your care we would appreciate if you could take just a few minutes to complete the survey questions. We read all of your comments and take your feedback very seriously. Thank you again for choosing our office.  [facility] ([address] [place], [place] [address]) Entrance C, located off of [address] Free 24/7 valet parking   CLASSES: Go to [email or web address] to register for classes (childbirth, breastfeeding, waterbirth, infant CPR, daddy bootcamp, etc.)  Call the office ([phone]) or go to [facility] if: You begin to have strong, frequent contractions Your water breaks.  Sometimes it is a big gush of fluid, sometimes it is just a trickle that keeps getting your panties wet or running down your legs You have vaginal bleeding.  It is normal to have a small amount of spotting if your cervix was checked.  You don't feel your baby moving like normal.  If you don't, get you something to eat and drink and lay down and focus on feeling your baby move.   If your baby is still not moving like normal, you should call the office or go to [facility].  Call the office ([phone]) or go to [facility] for these signs of pre-eclampsia: Severe headache that does not go away with Tylenol Visual changes- seeing spots, double, blurred vision Pain under your right breast or upper abdomen that does not go away with Tums or heartburn medicine Nausea and/or vomiting Severe swelling in your hands, feet, and face   Tdap Vaccine It is recommended that you get the Tdap vaccine during the third trimester of EACH pregnancy to help protect your baby from getting pertussis (whooping cough) 27-36 weeks is the BEST time to do  this so that you can pass the protection on to your baby. During pregnancy is better than after pregnancy, but if you are unable to get it during pregnancy it will be offered at the hospital.  You can get this vaccine with us, at the health department, your family doctor, or some local pharmacies Everyone who will be around your baby should also be up-to-date on their vaccines before the baby comes. Adults (who are not pregnant) only need 1 dose of Tdap during adulthood.   [place] Pediatricians/Family Doctors [facility] ([facility]): [address], [phone]           [facility]: [address], [phone]                [facility] ([facility]): [address], [phone] (call to ask if accepting patients) [facility]: [address], [place], [phone]    [place] Pediatricians/Family Doctors [facility] ([facility]): [address], [phone] [facility]: [address], [phone] [facility]: [address], [phone]  [place] [facility]  [facility] ([facility]): [phone] [facility]: [address], [phone]   [place] Family Doctors [facility]: [address], [phone]  [place] [facility]  [facility]: [address], [phone]  Home Blood Pressure Monitoring for Patients   Your provider has recommended that you check your   blood pressure (BP) at least once a week at home. If you do not have a blood pressure cuff at home, one will be provided for you. Contact your provider if you have not received your monitor within 1 week.   Helpful Tips for Accurate Home Blood Pressure Checks  Don't smoke, exercise, or drink caffeine 30 minutes before checking your BP Use the restroom before checking your BP (a full bladder can raise your  pressure) Relax in a comfortable upright chair Feet on the ground Left arm resting comfortably on a flat surface at the level of your heart Legs uncrossed Back supported Sit quietly and don't talk Place the cuff on your bare arm Adjust snuggly, so that only two fingertips can fit between your skin and the top of the cuff Check 2 readings separated by at least one minute Keep a log of your BP readings For a visual, please reference this diagram: http://ccnc.care/bpdiagram  Provider Name: [facility]     Phone: [phone]  Zone 1: ALL CLEAR  Continue to monitor your symptoms:  BP reading is less than 140 (top number) or less than 90 (bottom number)  No right upper stomach pain No headaches or seeing spots No feeling nauseated or throwing up No swelling in face and hands  Zone 2: CAUTION Call your doctor's office for any of the following:  BP reading is greater than 140 (top number) or greater than 90 (bottom number)  Stomach pain under your ribs in the middle or right side Headaches or seeing spots Feeling nauseated or throwing up Swelling in face and hands  Zone 3: EMERGENCY  Seek immediate medical care if you have any of the following:  BP reading is greater than160 (top number) or greater than 110 (bottom number) Severe headaches not improving with Tylenol Serious difficulty catching your breath Any worsening symptoms from Zone 2  Preterm Labor and Birth Information  The normal length of a pregnancy is 39-41 weeks. Preterm labor is when labor starts before 37 completed weeks of pregnancy. What are the risk factors for preterm labor? Preterm labor is more likely to occur in women who: Have certain infections during pregnancy such as a bladder infection, sexually transmitted infection, or infection inside the uterus (chorioamnionitis). Have a shorter-than-normal cervix. Have gone into preterm labor before. Have had surgery on their cervix. Are younger than age 17  or older than age 35. Are African American. Are pregnant with twins or multiple babies (multiple gestation). Take street drugs or smoke while pregnant. Do not gain enough weight while pregnant. Became pregnant shortly after having been pregnant. What are the symptoms of preterm labor? Symptoms of preterm labor include: Cramps similar to those that can happen during a menstrual period. The cramps may happen with diarrhea. Pain in the abdomen or lower back. Regular uterine contractions that may feel like tightening of the abdomen. A feeling of increased pressure in the pelvis. Increased watery or bloody mucus discharge from the vagina. Water breaking (ruptured amniotic sac). Why is it important to recognize signs of preterm labor? It is important to recognize signs of preterm labor because babies who are born prematurely may not be fully developed. This can put them at an increased risk for: Long-term (chronic) heart and lung problems. Difficulty immediately after birth with regulating body systems, including blood sugar, body temperature, heart rate, and breathing rate. Bleeding in the brain. Cerebral palsy. Learning difficulties. Death. These risks are highest for babies who are born before 34 weeks   of pregnancy. How is preterm labor treated? Treatment depends on the length of your pregnancy, your condition, and the health of your baby. It may involve: Having a stitch (suture) placed in your cervix to prevent your cervix from opening too early (cerclage). Taking or being given medicines, such as: Hormone medicines. These may be given early in pregnancy to help support the pregnancy. Medicine to stop contractions. Medicines to help mature the baby's lungs. These may be prescribed if the risk of delivery is high. Medicines to prevent your baby from developing cerebral palsy. If the labor happens before 34 weeks of pregnancy, you may need to stay in the hospital. What should I do if I  think I am in preterm labor? If you think that you are going into preterm labor, call your health care provider right away. How can I prevent preterm labor in future pregnancies? To increase your chance of having a full-term pregnancy: Do not use any tobacco products, such as cigarettes, chewing tobacco, and e-cigarettes. If you need help quitting, ask your health care provider. Do not use street drugs or medicines that have not been prescribed to you during your pregnancy. Talk with your health care provider before taking any herbal supplements, even if you have been taking them regularly. Make sure you gain a healthy amount of weight during your pregnancy. Watch for infection. If you think that you might have an infection, get it checked right away. Make sure to tell your health care provider if you have gone into preterm labor before. This information is not intended to replace advice given to you by your health care provider. Make sure you discuss any questions you have with your health care provider. Document Revised: 02/11/2019 Document Reviewed: 03/12/2016 Elsevier Patient Education  2020 Elsevier Inc.   

## 2024-12-01 NOTE — Progress Notes (Signed)
 "  HIGH-RISK PREGNANCY VISIT Patient name: Monique Fox MRN [id number]  Date of birth: 03-27-1996 Chief Complaint:   Routine Prenatal Visit  History of Present Illness:   Monique Fox is a 29 y.o. G46P0101 female at [redacted]w[redacted]d with an Estimated Date of Delivery: 01/03/25 being seen today for ongoing management of a high-risk pregnancy complicated by diabetes mellitus A2DM currently on metformin  1000mg  qhs and glyburide  2.5mg  at bedtime (hasn't started insulin  yet)  and PG BMI 47.    Today she reports pharmacy just sent her insulin  yesterday, and w/o syringes, so hasn't been able to start, so has just continued metformin /glyburide . Fastings 87-101 (only 1 wnl). Contractions: Irregular. Vag. Bleeding: None.  Movement: Present. denies leaking of fluid.      06/21/2024    9:23 AM 08/04/2022    9:27 AM 01/23/2022   10:02 AM 02/24/2017    9:08 AM  Depression screen PHQ 2/9  Decreased Interest 0 0 0 0  Down, Depressed, Hopeless 0 0 0 0  PHQ - 2 Score 0 0 0 0  Altered sleeping 0 0 1   Tired, decreased energy 1 1 1    Change in appetite 0 0 1   Feeling bad or failure about yourself  0 0 1   Trouble concentrating 0 0 0   Moving slowly or fidgety/restless 0 0 0   Suicidal thoughts 0 0 0   PHQ-9 Score 1  1  4        Data saved with a previous flowsheet row definition        06/21/2024    9:23 AM 08/04/2022    9:28 AM 01/23/2022   10:03 AM  GAD 7 : Generalized Anxiety Score  Nervous, Anxious, on Edge 0  0  2   Control/stop worrying 0  0  2   Worry too much - different things 0  0  2   Trouble relaxing 0  0  2   Restless 0  0  1   Easily annoyed or irritable 0  0  1   Afraid - awful might happen 0  0  0   Total GAD 7 Score 0 0 10     Data saved with a previous flowsheet row definition     Review of Systems:   Pertinent items are noted in HPI Denies abnormal vaginal discharge w/ itching/odor/irritation, headaches, visual changes, shortness of breath, chest pain, abdominal pain, severe  nausea/vomiting, or problems with urination or bowel movements unless otherwise stated above. Pertinent History Reviewed:  Reviewed past medical,surgical, social, obstetrical and family history.  Reviewed problem list, medications and allergies. Physical Assessment:   Vitals:   12/01/24 1125  BP: 126/84  Pulse: 91  Weight: 271 lb 3.2 oz (123 kg)  Body mass index is 48.04 kg/m.           Physical Examination:   General appearance: alert, well appearing, and in no distress  Mental status: alert, oriented to person, place, and time  Skin: warm & dry   Extremities:      Cardiovascular: normal heart rate noted  Respiratory: normal respiratory effort, no distress  Abdomen: gravid, soft, non-tender  Pelvic: Cervical exam performed  Dilation: 4 Effacement (%): 50 Station: -2  Fetal Status:     Movement: Present Presentation: Vertex  Fetal Surveillance Testing today: US  35+2 wks,cephalic,BPP 8/8,AFI 16 cm,FHR 155 bpm,posterior placenta gr 1,limited view   Chaperone: Alan Fischer  Results for orders placed or performed in visit on 12/01/24 (from  the past 24 hours)  POC Urinalysis Dipstick OB   Collection Time: 12/01/24 11:30 AM  Result Value Ref Range   Color, UA     Clarity, UA     Glucose, UA Negative Negative   Bilirubin, UA     Ketones, UA neg    Spec Grav, UA     Blood, UA neg    pH, UA     POC,PROTEIN,UA Negative Negative, Trace, Small (1+), Moderate (2+), Large (3+), 4+   Urobilinogen, UA     Nitrite, UA neg    Leukocytes, UA Trace (A) Negative   Appearance     Odor      Assessment & Plan:  High-risk pregnancy: G2P0101 at [redacted]w[redacted]d with an Estimated Date of Delivery: 01/03/25   1) A2DM, uncontrolled, was unable to start insulin  (pharmacy just sent it yesterday and w/o syringes). To go to [place] now to get syringes. Let us  know if can't get them for some reason. Continue metformin  1000mg  at bedtime, start NPH 10u at bedtime tonight (stop glyburide ). EFW at 32  97%, normal AFI today w/ BPP 8/8. Repeat EFW next week. Has IOL scheduled for 37w on 2/10  2) PGBMI 47  3) H/O 36wk PTB> contracts more at work, currently 4cm, note given to begin maternity leave. Reviewed ptl s/s, reasons to seek care  Meds: No orders of the defined types were placed in this encounter.   Labs/procedures today: GBS, GC/CT, SVE, and U/S  Treatment Plan: EFW q 4wks      NST/BPP    Deliver @ 37w  Reviewed: Preterm labor symptoms and general obstetric precautions including but not limited to vaginal bleeding, contractions, leaking of fluid and fetal movement were reviewed in detail with the patient.  All questions were answered. Does have home bp cuff. Office bp cuff given: not applicable. Check bp daily, let us  know if consistently >140 and/or >90.  Follow-up: Return for NST/nurse 2/2; 2/5 change to MD only; NST/nurse 2/9.   Future Appointments  Date Time Provider Department Center  12/05/2024  2:10 PM [facility] NURSE [facility]  12/08/2024  8:30 AM [facility] - [facility] US  [facility] None  12/08/2024  9:30 AM Ozan, Jennifer, DO [facility]  12/12/2024  2:10 PM [facility] NURSE [facility]  12/13/2024 12:00 AM [facility] SCHED ROOM [facility] None    Orders Placed This Encounter  Procedures   Culture, beta strep (group b only)   POC Urinalysis Dipstick OB   [name] CNM, [facility] 12/01/2024 11:59 AM  "

## 2024-12-01 NOTE — Progress Notes (Signed)
 US  35+2 wks,cephalic,BPP 8/8,AFI 16 cm,FHR 155 bpm,posterior placenta gr 1,limited view

## 2024-12-02 LAB — CERVICOVAGINAL ANCILLARY ONLY
Chlamydia: NEGATIVE
Comment: NEGATIVE
Comment: NORMAL
Neisseria Gonorrhea: NEGATIVE

## 2024-12-04 ENCOUNTER — Encounter: Payer: Self-pay | Admitting: *Deleted

## 2024-12-05 ENCOUNTER — Telehealth (HOSPITAL_COMMUNITY): Payer: Self-pay | Admitting: *Deleted

## 2024-12-05 ENCOUNTER — Encounter (HOSPITAL_COMMUNITY): Payer: Self-pay | Admitting: *Deleted

## 2024-12-05 ENCOUNTER — Other Ambulatory Visit

## 2024-12-05 LAB — CULTURE, BETA STREP (GROUP B ONLY): Strep Gp B Culture: POSITIVE — AB

## 2024-12-05 NOTE — Telephone Encounter (Signed)
 Preadmission screen

## 2024-12-06 ENCOUNTER — Ambulatory Visit

## 2024-12-06 VITALS — BP 134/83 | HR 90

## 2024-12-06 DIAGNOSIS — Z3A36 36 weeks gestation of pregnancy: Secondary | ICD-10-CM

## 2024-12-06 DIAGNOSIS — O24419 Gestational diabetes mellitus in pregnancy, unspecified control: Secondary | ICD-10-CM

## 2024-12-06 DIAGNOSIS — O0993 Supervision of high risk pregnancy, unspecified, third trimester: Secondary | ICD-10-CM

## 2024-12-06 NOTE — Progress Notes (Cosign Needed)
" ° °  NURSE VISIT- NST  SUBJECTIVE:  Monique Fox is a 29 y.o. G28P0101 female at [redacted]w[redacted]d, here for a NST for pregnancy complicated by Diabetes: A2DM.  She reports active fetal movement, contractions: none, vaginal bleeding: none, membranes: intact.   OBJECTIVE:  BP 134/83   Pulse 90   LMP 03/18/2024   Appears well, no apparent distress  No results found for this or any previous visit (from the past 24 hours).  NST: FHR baseline 155 bpm, Variability: moderate, Accelerations:present, Decelerations:  Absent= Cat 1/reactive Toco: none   ASSESSMENT: G2P0101 at [redacted]w[redacted]d with Diabetes: A2DM NST reactive  PLAN: EFM strip reviewed by Dr. Jayne   Recommendations: keep next appointment as scheduled    Aleck FORBES Blase  12/06/2024 12:10 PM  "

## 2024-12-08 ENCOUNTER — Other Ambulatory Visit

## 2024-12-08 ENCOUNTER — Ambulatory Visit: Admitting: Obstetrics & Gynecology

## 2024-12-08 VITALS — BP 157/96 | HR 71 | Wt 269.0 lb

## 2024-12-08 DIAGNOSIS — Z6841 Body Mass Index (BMI) 40.0 and over, adult: Secondary | ICD-10-CM

## 2024-12-08 DIAGNOSIS — F419 Anxiety disorder, unspecified: Secondary | ICD-10-CM

## 2024-12-08 DIAGNOSIS — O3663X0 Maternal care for excessive fetal growth, third trimester, not applicable or unspecified: Secondary | ICD-10-CM

## 2024-12-08 DIAGNOSIS — O99820 Streptococcus B carrier state complicating pregnancy: Secondary | ICD-10-CM

## 2024-12-08 DIAGNOSIS — O0992 Supervision of high risk pregnancy, unspecified, second trimester: Secondary | ICD-10-CM

## 2024-12-08 DIAGNOSIS — O24419 Gestational diabetes mellitus in pregnancy, unspecified control: Secondary | ICD-10-CM

## 2024-12-08 DIAGNOSIS — Z3A37 37 weeks gestation of pregnancy: Secondary | ICD-10-CM

## 2024-12-08 DIAGNOSIS — O0993 Supervision of high risk pregnancy, unspecified, third trimester: Secondary | ICD-10-CM

## 2024-12-08 DIAGNOSIS — Z3A36 36 weeks gestation of pregnancy: Secondary | ICD-10-CM

## 2024-12-08 DIAGNOSIS — E669 Obesity, unspecified: Secondary | ICD-10-CM

## 2024-12-08 DIAGNOSIS — O99213 Obesity complicating pregnancy, third trimester: Secondary | ICD-10-CM

## 2024-12-08 DIAGNOSIS — O163 Unspecified maternal hypertension, third trimester: Secondary | ICD-10-CM

## 2024-12-08 DIAGNOSIS — J45909 Unspecified asthma, uncomplicated: Secondary | ICD-10-CM

## 2024-12-08 DIAGNOSIS — O24414 Gestational diabetes mellitus in pregnancy, insulin controlled: Secondary | ICD-10-CM

## 2024-12-08 DIAGNOSIS — O99513 Diseases of the respiratory system complicating pregnancy, third trimester: Secondary | ICD-10-CM

## 2024-12-08 DIAGNOSIS — O0990 Supervision of high risk pregnancy, unspecified, unspecified trimester: Secondary | ICD-10-CM

## 2024-12-08 DIAGNOSIS — O99343 Other mental disorders complicating pregnancy, third trimester: Secondary | ICD-10-CM

## 2024-12-08 NOTE — Patient Instructions (Signed)
 Please head directly to the hospital if you note any of the following:  -Severe headache that does not resolve with tylenol  -change in your vision -Blood pressures x 2 above 160/110 (either number elevated) -Right upper quadrant pain that doesn't go away  Otherwise plan for IOL as scheduled on 2/10

## 2024-12-08 NOTE — Progress Notes (Signed)
 US  36+2 wks,cephalic,FHR 156 bpm,BPP 8/8,posterior placenta gr 3,AFI 14 cm,EFW 3598 g 97%

## 2024-12-08 NOTE — Progress Notes (Addendum)
 "  HIGH-RISK PREGNANCY VISIT Patient name: Monique Fox MRN [id number]  Date of birth: 08/28/1996 Chief Complaint:   Routine Prenatal Visit  History of Present Illness:   Monique Fox is a 29 y.o. G30P0101 female at [redacted]w[redacted]d with an Estimated Date of Delivery: 01/03/25 being seen today for ongoing management of a high-risk pregnancy complicated by:  -GDMA2 Currently on metformin  1000 mg nightly and NPH 10 units at night Well-controlled though slight elevation in some of her fastings  -Obesity -Asthma -Anxiety -GBS positive  Today she reports irregular contractions.  She may have a few hours every 6 minutes and then complete resolution of her symptoms.   Contractions: Irregular. Vag. Bleeding: None.  Movement: Present. denies leaking of fluid.      06/21/2024    9:23 AM 08/04/2022    9:27 AM 01/23/2022   10:02 AM 02/24/2017    9:08 AM  Depression screen PHQ 2/9  Decreased Interest 0 0 0 0  Down, Depressed, Hopeless 0 0 0 0  PHQ - 2 Score 0 0 0 0  Altered sleeping 0 0 1   Tired, decreased energy 1 1 1    Change in appetite 0 0 1   Feeling bad or failure about yourself  0 0 1   Trouble concentrating 0 0 0   Moving slowly or fidgety/restless 0 0 0   Suicidal thoughts 0 0 0   PHQ-9 Score 1  1  4        Data saved with a previous flowsheet row definition     Current Outpatient Medications  Medication Instructions   albuterol  (VENTOLIN  HFA) 108 (90 Base) MCG/ACT inhaler TAKE 2 PUFFS BY MOUTH EVERY 6 HOURS AS NEEDED FOR WHEEZE OR SHORTNESS OF BREATH   Blood Glucose Monitoring Suppl (FREESTYLE LITE) w/Device KIT Use as directed to check blood sugar in the morning, at noon, in the evening, and at bedtime.   budesonide -formoterol  (SYMBICORT ) 80-4.5 MCG/ACT inhaler 2 puffs, Inhalation, 2 times daily   cetirizine (ZYRTEC) 10 mg, Daily   glucose blood test strip Use to check blood sugars four times a day. (Fasting and two hours after each meal)   glucose blood test strip Use as instructed  to check blood sugar four times daily   glyBURIDE  (DIABETA ) 2.5 mg, Oral, Daily at bedtime   HumuLIN  N 10 Units, Subcutaneous, Daily at bedtime   hydrOXYzine  (ATARAX ) 10 mg, Oral, 3 times daily PRN   Lancets (FREESTYLE) lancets Use as instructed to check blood sugar four times daily   ondansetron  (ZOFRAN ) 4 mg, Oral, Every 8 hours PRN   Prenatal Vit-Fe Fumarate-FA (PNV PRENATAL PLUS MULTIVITAMIN) 27-1 MG TABS 1 tablet, Oral, Daily   SYMBICORT  80-4.5 MCG/ACT inhaler 2 puffs, 2 times daily     Review of Systems:   Pertinent items are noted in HPI Denies abnormal vaginal discharge w/ itching/odor/irritation, headaches, visual changes, shortness of breath, chest pain, abdominal pain, severe nausea/vomiting, or problems with urination or bowel movements unless otherwise stated above. Pertinent History Reviewed:  Reviewed past medical,surgical, social, obstetrical and family history.  Reviewed problem list, medications and allergies. Physical Assessment:   Vitals:   12/08/24 0931 12/08/24 0941  BP: (!) 150/85 (!) 157/96  Pulse: 71   Weight: 269 lb (122 kg)   Body mass index is 47.65 kg/m.           Physical Examination:   General appearance: alert, well appearing, and in no distress  Mental status: normal mood, behavior, speech, dress, motor activity,  and thought processes  Skin: warm & dry   Extremities:      Cardiovascular: normal heart rate noted  Respiratory: normal respiratory effort, no distress  Abdomen: gravid, soft, non-tender  Pelvic: Cervical exam performed  Dilation: 4 Effacement (%): 50 Station: -2  Fetal Status:     Movement: Present    Fetal Surveillance Testing today: Cephalic,FHR 156 bpm,BPP 8/8,posterior placenta gr 3,AFI 14 cm,EFW 3598 g 97%    Chaperone: pt declined    No results found for this or any previous visit (from the past 24 hours).   Assessment & Plan:  High-risk pregnancy: G2P0101 at [redacted]w[redacted]d with an Estimated Date of Delivery: 01/03/25   -GDMA2 Change insulin  to 12 units @ bedtime, continue metformin  Growth as above- discussed LGA findings.   Reviewed risk of shoulder dystocia due to GDMA2 and LGA; however, reviewed that there is not a guaranteed method to predict its occurrence Based on ACOG recommendation- pt does not meet criteria for primary LTCS (EFW >4500g) and would recommend trial of labor -questions/concerns were addressed and pt agreeable  -Possible Gestational HTN Today's blood pressure- were first elevation in pregnancy Currently asymptomatic Patient already scheduled for IOL at 37 weeks Based on lab work today Patient given preeclampsia precautions with distraction to go to hospital should she note any changes -Obesity -Asthma -Anxiety -GBS positive  Meds: No orders of the defined types were placed in this encounter.   Labs/procedures today: BPP/growth scan  Treatment Plan:  routine OB care and as outlined above  Reviewed: Preterm labor symptoms and general obstetric precautions including but not limited to vaginal bleeding, contractions, leaking of fluid and fetal movement were reviewed in detail with the patient.  All questions were answered. Pt has home bp cuff.  Patient given preeclampsia precautions, patient to go directly to hospital should she note elevated BP above 160/110 or other symptoms  Follow-up: Return for IOL scheduled.   Future Appointments  Date Time Provider Department Center  12/12/2024  2:10 PM [facility] NURSE [facility]  12/13/2024 12:00 AM [facility] SCHED ROOM [facility] None    Orders Placed This Encounter  Procedures   Protein / creatinine ratio, urine   CBC   Comprehensive metabolic panel with GFR    Brenyn Petrey, DO Attending Obstetrician & Gynecologist, [facility] [employer], [facility]     "

## 2024-12-09 LAB — COMPREHENSIVE METABOLIC PANEL WITH GFR
ALT: 11 [IU]/L (ref 0–32)
AST: 14 [IU]/L (ref 0–40)
Albumin: 3.2 g/dL — ABNORMAL LOW (ref 4.0–5.0)
Alkaline Phosphatase: 160 [IU]/L — ABNORMAL HIGH (ref 41–116)
BUN/Creatinine Ratio: 6 — ABNORMAL LOW (ref 9–23)
BUN: 4 mg/dL — ABNORMAL LOW (ref 6–20)
Bilirubin Total: 0.3 mg/dL (ref 0.0–1.2)
CO2: 18 mmol/L — ABNORMAL LOW (ref 20–29)
Calcium: 9.9 mg/dL (ref 8.7–10.2)
Chloride: 103 mmol/L (ref 96–106)
Creatinine, Ser: 0.65 mg/dL (ref 0.57–1.00)
Globulin, Total: 3.5 g/dL (ref 1.5–4.5)
Glucose: 89 mg/dL (ref 70–99)
Potassium: 4.2 mmol/L (ref 3.5–5.2)
Sodium: 138 mmol/L (ref 134–144)
Total Protein: 6.7 g/dL (ref 6.0–8.5)
eGFR: 122 mL/min/{1.73_m2}

## 2024-12-09 LAB — CBC
Hematocrit: 35.4 % (ref 34.0–46.6)
Hemoglobin: 11.5 g/dL (ref 11.1–15.9)
MCH: 26.7 pg (ref 26.6–33.0)
MCHC: 32.5 g/dL (ref 31.5–35.7)
MCV: 82 fL (ref 79–97)
Platelets: 317 10*3/uL (ref 150–450)
RBC: 4.3 x10E6/uL (ref 3.77–5.28)
RDW: 12.9 % (ref 11.7–15.4)
WBC: 12.3 10*3/uL — ABNORMAL HIGH (ref 3.4–10.8)

## 2024-12-09 LAB — PROTEIN / CREATININE RATIO, URINE
Creatinine, Urine: 62 mg/dL
Protein, Ur: 16.2 mg/dL
Protein/Creat Ratio: 261 mg/g{creat} — ABNORMAL HIGH (ref 0–200)

## 2024-12-12 ENCOUNTER — Ambulatory Visit

## 2024-12-13 ENCOUNTER — Inpatient Hospital Stay (HOSPITAL_COMMUNITY)

## 2024-12-13 ENCOUNTER — Inpatient Hospital Stay (HOSPITAL_COMMUNITY): Admission: AD | Admit: 2024-12-13 | Source: Home / Self Care

## 2024-12-14 ENCOUNTER — Encounter: Admitting: Women's Health

## 2024-12-14 ENCOUNTER — Other Ambulatory Visit

## 2024-12-21 ENCOUNTER — Encounter: Admitting: Advanced Practice Midwife

## 2024-12-21 ENCOUNTER — Other Ambulatory Visit: Admitting: Radiology

## 2024-12-28 ENCOUNTER — Encounter: Admitting: Obstetrics & Gynecology

## 2024-12-28 ENCOUNTER — Other Ambulatory Visit
